# Patient Record
Sex: Male | Born: 1970 | Race: White | Hispanic: No | Marital: Married | Smoking: Never smoker
Health system: Southern US, Community
[De-identification: ages and names within clinical notes are randomized; demographics above are authoritative.]

## PROBLEM LIST (undated history)

## (undated) DIAGNOSIS — K589 Irritable bowel syndrome without diarrhea: Secondary | ICD-10-CM

## (undated) DIAGNOSIS — Z872 Personal history of diseases of the skin and subcutaneous tissue: Secondary | ICD-10-CM

## (undated) DIAGNOSIS — K635 Polyp of colon: Secondary | ICD-10-CM

## (undated) HISTORY — PX: MINOR HEMORRHOIDECTOMY: SHX6238

## (undated) HISTORY — DX: Personal history of diseases of the skin and subcutaneous tissue: Z87.2

## (undated) HISTORY — PX: COLONOSCOPY W/ BIOPSIES: SHX1374

## (undated) HISTORY — PX: COLONOSCOPY: SHX174

## (undated) HISTORY — DX: Irritable bowel syndrome, unspecified: K58.9

## (undated) HISTORY — PX: VASECTOMY: SHX75

## (undated) HISTORY — DX: Polyp of colon: K63.5

---

## 2005-10-09 HISTORY — PX: PILONIDAL CYST EXCISION: SHX744

## 2006-12-20 ENCOUNTER — Emergency Department (HOSPITAL_COMMUNITY): Admission: EM | Admit: 2006-12-20 | Discharge: 2006-12-20 | Payer: Self-pay | Admitting: Emergency Medicine

## 2007-11-26 ENCOUNTER — Ambulatory Visit: Payer: Self-pay | Admitting: Family Medicine

## 2007-11-26 DIAGNOSIS — R197 Diarrhea, unspecified: Secondary | ICD-10-CM | POA: Insufficient documentation

## 2007-11-28 LAB — CONVERTED CEMR LAB
ALT: 25 units/L (ref 0–35)
AST: 23 units/L (ref 0–37)
Albumin: 4.2 g/dL (ref 3.5–5.2)
Basophils Absolute: 0 10*3/uL (ref 0.0–0.1)
Basophils Relative: 0.3 % (ref 0.0–1.0)
CO2: 30 meq/L (ref 19–32)
Calcium: 9.3 mg/dL (ref 8.4–10.5)
Chloride: 104 meq/L (ref 96–112)
Cholesterol: 210 mg/dL (ref 0–200)
Eosinophils Absolute: 0.1 10*3/uL (ref 0.0–0.6)
Eosinophils Relative: 1.2 % (ref 0.0–5.0)
GFR calc Af Amer: 73 mL/min
Glucose, Bld: 92 mg/dL (ref 70–99)
HCT: 41.9 % (ref 36.0–46.0)
Lymphocytes Relative: 32.1 % (ref 12.0–46.0)
MCV: 89.1 fL (ref 78.0–100.0)
Monocytes Absolute: 0.4 10*3/uL (ref 0.2–0.7)
Monocytes Relative: 7.9 % (ref 3.0–11.0)
RDW: 11.8 % (ref 11.5–14.6)
Sed Rate: 15 mm/hr (ref 0–25)
TSH: 1.11 microintl units/mL (ref 0.35–5.50)
Total Bilirubin: 0.8 mg/dL (ref 0.3–1.2)
Triglycerides: 166 mg/dL — ABNORMAL HIGH (ref 0–149)
WBC: 5.3 10*3/uL (ref 4.5–10.5)

## 2010-02-01 ENCOUNTER — Ambulatory Visit: Payer: Self-pay | Admitting: Family Medicine

## 2010-02-01 DIAGNOSIS — S40269A Insect bite (nonvenomous) of unspecified shoulder, initial encounter: Secondary | ICD-10-CM | POA: Insufficient documentation

## 2010-02-01 DIAGNOSIS — W57XXXA Bitten or stung by nonvenomous insect and other nonvenomous arthropods, initial encounter: Secondary | ICD-10-CM

## 2010-05-06 ENCOUNTER — Telehealth (INDEPENDENT_AMBULATORY_CARE_PROVIDER_SITE_OTHER): Payer: Self-pay | Admitting: *Deleted

## 2010-05-09 ENCOUNTER — Ambulatory Visit: Payer: Self-pay | Admitting: Family Medicine

## 2010-05-09 LAB — CONVERTED CEMR LAB
Albumin: 3.9 g/dL (ref 3.5–5.2)
Alkaline Phosphatase: 87 units/L (ref 39–117)
Bilirubin, Direct: 0.1 mg/dL (ref 0.0–0.3)
CO2: 29 meq/L (ref 19–32)
Cholesterol: 205 mg/dL — ABNORMAL HIGH (ref 0–200)
Creatinine, Ser: 1 mg/dL (ref 0.4–1.2)
Direct LDL: 82 mg/dL
Glucose, Bld: 101 mg/dL — ABNORMAL HIGH (ref 70–99)
Total Bilirubin: 0.6 mg/dL (ref 0.3–1.2)
Total CHOL/HDL Ratio: 6
VLDL: 79.2 mg/dL — ABNORMAL HIGH (ref 0.0–40.0)

## 2010-05-20 ENCOUNTER — Ambulatory Visit: Payer: Self-pay | Admitting: Family Medicine

## 2010-08-02 ENCOUNTER — Ambulatory Visit: Payer: Self-pay | Admitting: Internal Medicine

## 2010-08-02 ENCOUNTER — Encounter: Payer: Self-pay | Admitting: Family Medicine

## 2010-08-02 DIAGNOSIS — L255 Unspecified contact dermatitis due to plants, except food: Secondary | ICD-10-CM

## 2010-11-08 NOTE — Letter (Signed)
Summary: Out of Work  [facility] at [facility]  [address] [place], [place] [address]   Phone: [phone]  Fax: [phone]    August 02, 2010   Employee:  Raymond Lloyd    To Whom It May Concern:   For Medical reasons, please excuse the above named employee from work for the following dates:  Start:  August 02, 2010   End:  August 02, 2010   Pt should be able to return to full duty, however if he feels he can't he may return to light duty.  If you need additional information, please feel free to contact our office.         Sincerely,    Eustaquio Boyden  MD

## 2010-11-08 NOTE — Assessment & Plan Note (Signed)
Summary: SPIDER BITE/DLO   Vital Signs:  Patient profile:   40 year old male Height:      71 inches Weight:      204.75 pounds BMI:     28.66 Temp:     98.2 degrees F oral Pulse rate:   72 / minute Pulse rhythm:   regular BP sitting:   112 / 72  (right arm) Cuff size:   large  Vitals Entered By: Lewanda Rife LPN (February 01, 2010 9:01 AM) CC: spider bite to left upper arm on 01/30/10   History of Present Illness: was out mowing sunday - felt a bite -- wiped off bug and did not see it  bite area is gradually growing  does not feel like a sting -- and no tic  no fever- feeling fine  area on R arm - is itching mostly and hurting just a bit   at night is taking some benadryl-- this helps  in daytme some benadryl cream   has never had a bit like this before   Allergies (verified): No Known Drug Allergies  Past History:  Past Surgical History: Last updated: 11/26/2007 2007 pilonidal cyst removal vasectomy  Family History: Last updated: 11/26/2007 father: HTN, high chol PGF: MI age 28 mother: breast cancer PGM: ? cancer ? 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 Eyes:  Denies blurring and eye pain. CV:  Denies chest pain or discomfort, palpitations, shortness of breath with exertion, and swelling of feet. Resp:  Denies cough and wheezing. GI:  Denies abdominal pain, change in bowel habits, nausea, and vomiting. GU:  Denies dysuria. MS:  Denies joint pain and muscle aches. Derm:  Complains of itching and  lesion(s); denies poor wound healing and rash. Neuro:  Denies numbness and tingling. Heme:  Denies abnormal bruising and enlarge lymph nodes.  Physical Exam  General:  Well-developed,well-nourished,in no acute distress; alert,appropriate and cooperative throughout examination Head:  normocephalic, atraumatic, and no abnormalities observed.   Eyes:  vision grossly intact, pupils equal, pupils round, and pupils reactive to light.  no injection of d/c  Neck:  No deformities, masses, or tenderness noted. Heart:  Normal rate and regular rhythm. S1 and S2 normal without gallop, murmur, click, rub or other extra sounds. Msk:  no acute joint changes  Extremities:  No clubbing, cyanosis, edema, or deformity noted with normal full range of motion of all joints.   Skin:  2-3 cm area of induration and redness upper L arm (bicep area) papule in center no stinger or insect parts seen no bruising or necrosis noted no excoriations Cervical Nodes:  No lymphadenopathy noted Psych:  normal affect, talkative and pleasant    Impression & Recommendations:  Problem # 1:  INSECT BITE, UPPER ARM (ICD-912.4) Assessment New  with redness / induration that is worsening / but no constitutional symptoms  will tx for both all rxn and infection -- since unable to tell which  px keflex two times a day/ elicon cream and will try zyrtec  lines drawn around area of redness- if this increases or any worsening - adv to update  asap  also update if fever or not imp in several days   Orders: Prescription Created Electronically [phone])  Complete Medication List: 1)  Multivitamins Tabs (Multiple vitamin) .... Take 1 tablet by mouth once a day 2)  Fish Oil Oil (Fish oil) .... Take one daily by mouth 3)  Glucosamine-chondroitin Caps (Glucosamine-chondroit-vit c-mn) .... Otc as directed. 4)  Benadryl 25 Mg Caps (Diphenhydramine hcl) .... Otc as directed. 5)  Keflex 500 Mg Caps (Cephalexin) .Marland Kitchen.. 1 by mouth two times a day  for 7 days with food 6)  Elocon 0.1 % Crea (Mometasone furoate) .... Apply to affected area once daily  Patient Instructions: 1)  keep area clean and dry 2)  update me if redness goes outside the line or if fever or other symptoms  3)  try zyrtec 10 mg once daily for itch  4)  use elicon cream daily  5)  take keflex as directed  Prescriptions: ELOCON 0.1 % CREA (MOMETASONE FUROATE) apply to affected area once daily  #1 small x 0   Entered and Authorized by:   Judith Part MD   Signed by:   Judith Part MD on 02/01/2010   Method used:   [facility] ([facility]ail[facility]       [address]       [place], [place]  [address]       Ph: [phone] or [phone]       Fax: [phone]   RxID:   [id number] KEFLEX 500 MG CAPS (CEPHALEXIN) 1 by mouth two times a day for 7 days with food  #14 x 0   Entered and Authorized by:   Judith Part MD   Signed by:   Judith Part MD on 02/01/2010   Method used:   [facility]        [facility] (retail)       [address]       [place], [place]  [address]       Ph: [phone] or [phone]       Fax: [phone]   RxID:   [id number]   Current Allergies (reviewed today): No known allergies

## 2010-11-08 NOTE — Assessment & Plan Note (Signed)
Summary: CPX  CYD SP W/ PT RESC   Vital Signs:  Patient profile:   40 year old male Height:      71 inches Weight:      197.4 pounds BMI:     27.63 Temp:     98.2 degrees F oral Pulse rate:   72 / minute Pulse rhythm:   regular BP sitting:   110 / 82  (left arm) Cuff size:   large  Vitals Entered By: Benny Lennert CMA Duncan Dull) (May 20, 2010 8:31 AM)  History of Present Illness: Chief complaint cpx   The patient is here for annual wellness exam and preventative care.    Poor diet..has started back exercising.   Problems Prior to Update: 1)  Preventive Health Care  (ICD-V70.0) 2)  Insect Bite, Upper Arm  (ICD-912.4) 3)  Screening For Lipoid Disorders  (ICD-V77.91) 4)  Diarrhea  (ICD-787.91)  Current Medications (verified): 1)  Multivitamins   Tabs (Multiple Vitamin) .... Take 1 Tablet By Mouth Once A Day 2)  Fish Oil   Oil (Fish Oil) .... Take One Daily By Mouth 3)  Glucosamine-Chondroitin  Caps (Glucosamine-Chondroit-Vit C-Mn) .... Otc As Directed.  Allergies (verified): No Known Drug Allergies  Past History:  Past medical, surgical, family and social histories (including risk factors) reviewed, and no changes noted (except as noted below).  Past Surgical History: Reviewed history from 11/26/2007 and no changes required. 2007 pilonidal cyst removal vasectomy  Family History: Reviewed history from 11/26/2007 and no changes required. father: HTN, high chol PGF: MI age 25 mother: breast cancer PGM: ? cancer ? maternal side history  Social History: Reviewed history from 11/26/2007 and no changes required. Occupation: Works for [employer], 12 hour shifts Previously in [employer], [employer] Married 2 children, healthy Never Smoked Alcohol use-yes, 0-6 per week Drug use-no Regular exercise-yes, 3-5 days per week Diet: fast food, water  Review of Systems       Eats a lot of fast food.  General:  Complains of fatigue. CV:  Denies chest pain or  discomfort. Resp:  Denies shortness of breath. GI:  Complains of bloody stools and diarrhea; denies abdominal pain and constipation; HAs always had diarrhea, stable...occ blood in stool over years after he has eaten something that triggers diarrhea...he has these bowel changes with soda.. Derm:  Denies rash. Psych:  Denies anxiety, depression, suicidal thoughts/plans, and thoughts /plans of harming others.  Physical Exam  General:  Well-developed,well-nourished,in no acute distress; alert,appropriate and cooperative throughout examination Eyes:  No corneal or conjunctival inflammation noted. EOMI. Perrla. Funduscopic exam benign, without hemorrhages, exudates or papilledema. Vision grossly normal. Ears:  External ear exam shows no significant lesions or deformities.  Otoscopic examination reveals clear canals, tympanic membranes are intact bilaterally without bulging, retraction, inflammation or discharge. Hearing is grossly normal bilaterally. Nose:  External nasal examination shows no deformity or inflammation. Nasal mucosa are pink and moist without lesions or exudates. Mouth:  Oral mucosa and oropharynx without lesions or exudates.  Teeth in good repair. Neck:  no carotid bruit or thyromegaly no cervical or supraclavicular lymphadenopathy  Lungs:  Normal respiratory effort, chest expands symmetrically. Lungs are clear to auscultation, no crackles or wheezes. Heart:  Normal rate and regular rhythm. S1 and S2 normal without gallop, murmur, click, rub or other extra sounds. Abdomen:  Bowel sounds positive,abdomen soft and non-tender without masses, organomegaly or hernias noted. Rectal:  refused Genitalia:  Normal introitus for age, no external lesions, no vaginal discharge, mucosa pink and  moist, no vaginal or cervical lesions, no vaginal atrophy, no friaility or hemorrhage, normal uterus size and position, no adnexal masses or tenderness Msk:  No deformity or scoliosis noted of thoracic or  lumbar spine.   Pulses:  R and L posterior tibial pulses are full and equal bilaterally  Extremities:  no edema Neurologic:  No cranial nerve deficits noted. Station and gait are normal. Plantar reflexes are down-going bilaterally. DTRs are symmetrical throughout. Sensory, motor and coordinative functions appear intact. Skin:  Intact without suspicious lesions or rashes Psych:  Cognition and judgment appear intact. Alert and cooperative with normal attention span and concentration. No apparent delusions, illusions, hallucinations   Impression & Recommendations:  Problem # 1:  Preventive Health Care (ICD-V70.0) The patient's preventative maintenance and recommended screening tests for an annual wellness exam were reviewed in full today. Brought up to date unless services declined.  Counselled on the importance of diet, exercise, and its role in overall health and mortality. The patient's FH and SH was reviewed, including their home life, tobacco status, and drug and alcohol status.     Problem # 2:  HYPERTRIGLYCERIDEMIA (ICD-272.1)  Encouraged exercise, weight loss, healthy eating habits.  Increase fish oil.  Recheck fasting LIPIDS, AST, ALT  in 6 months Dx 272.0     Labs Reviewed: SGOT: 21 (05/09/2010)   SGPT: 21 (05/09/2010)   HDL:34.60 (05/09/2010), 41.4 (11/26/2007)  LDL:DEL (11/26/2007)  Chol:205 (05/09/2010), 210 (11/26/2007)  Trig:396.0 (05/09/2010), 166 (11/26/2007)  Problem # 3:  DIARRHEA (ICD-787.91) Chronic...consider further eval...cbc, TSH, Gi referral. 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   Complete Medication List: 1)  Multivitamins Tabs (Multiple vitamin) .... Take 1 tablet by mouth once a day 2)  Fish Oil Oil (Fish oil) .... Take one daily by mouth 3)  Glucosamine-chondroitin Caps (Glucosamine-chondroit-vit c-mn) .... Otc as directed.  Other Orders: Tdap => 3yrs IM ([address]) Admin 1st Vaccine  ([address]) Admin 1st Vaccine [facility]) [phone])  Patient Instructions: 1)  Avoid fast food, fried foods, and carbohydrates. 2)  Increase exercise. 3)  Increase  fish oil 2000 mg divided daily.Marland Kitchenlook for active ingredients..DHA and EPA. 4)  Recheck fasting LIPIDS, AST, ALT  in 6 months Dx 272.0    5)  MAke follow up appt for further eval if blood in stool continuing or diarrhea increasing.  6)  Please schedule a follow-up appointment in 1 year.   Current Allergies (reviewed today): No known allergies      Tetanus/Td Vaccine    Vaccine Type: Tdap    Site: left deltoid    Mfr: GlaxoSmithKline    Dose: 0.5 ml    Route: IM    Given by: Benny Lennert CMA (AAMA)    Exp. Date: 04/07/2012    Lot #: [id number]    VIS given: 08/27/07 version given May 20, 2010.

## 2010-11-08 NOTE — Progress Notes (Signed)
----   Converted from flag ---- ---- 05/06/2010 12:36 AM, Kerby Nora MD wrote: CMEt, lipids Dx v77.91  ---- 05/05/2010 11:12 AM, Liane Comber CMA (AAMA) wrote: Pt is scheduled for cpx labs Monday, what labs to draw and dx codes? Thanks Tasha ------------------------------

## 2010-11-08 NOTE — Assessment & Plan Note (Signed)
Summary: POISON IVY/CLE   Vital Signs:  Patient profile:   40 year old male Weight:      198.25 pounds Temp:     98.5 degrees F oral Pulse rate:   74 / minute Pulse rhythm:   regular BP sitting:   110 / 80  (right arm) Cuff size:   large  Vitals Entered By: Selena Batten Dance CMA Duncan Dull) (August 02, 2010 9:20 AM) CC: Poison Ivy Comments Seen at urgent care on saturday. Given Predisone dose pack and steroid injection. Not getting any better.    History of Present Illness: CC: skin rash  broke out in rash saturday morning, went to [facility] sunday, dx poison ivy, prescribed 20mg  prednisone and shot of steroids at [facility].  Not helping.  Was working outside in [place] prior to break out.  Has also been using benadryl and calagel (anti itch).  h/o eczema in past.  + diarrhea since steroid shot  Also noted spiders but doesn't think bitten.  no fevers/chills, n/v, abd pain.  Current Medications (verified): 1)  Multivitamins   Tabs (Multiple Vitamin) .... Take 1 Tablet By Mouth Once A Day 2)  Fish Oil   Oil (Fish Oil) .... Take One Daily By Mouth 3)  Glucosamine-Chondroitin  Caps (Glucosamine-Chondroit-Vit C-Mn) .... Otc As Directed. 4)  Prednisone (Pak) 10 Mg Tabs (Prednisone) .... By Mouth As Directed 5)  [place]-N-Ivy Skin Cleanser  Liqd (Poison Ivy Treatments) .... As Directed 6)  Calagel Maximum Strength  Gel (Diphenhyd-Benzeth-Menth-Zn Ace) .... As Directed  Allergies (verified): No Known Drug Allergies  Past History:  Past Medical History: h/o eczema  Social History: Reviewed history from 11/26/2007 and no changes required. Occupation: Works for [employer], 12 hour shifts Previously in [employer], [employer] Married 2 children, healthy Never Smoked Alcohol use-yes, 0-6 per week Drug use-no Regular exercise-yes, 3-5 days per week Diet: fast food, water  Review of Systems       per HPI  Physical Exam  General:  Well-developed,well-nourished,in no acute distress;  alert,appropriate and cooperative throughout examination Skin:  papular some linear erythematous rash bilateral anterior forearms starting from wrist to elbow.  +++ pruritic.   Impression & Recommendations:  Problem # 1:  POISON IVY DERMATITIS (ICD-692.6)  in patient with h/o strong skin reactions to allergens/contact, h/o eczema.  increase dose of steroids and use triamcinolone ointment to help itch.  continue benadryl.  to call and let us know how he is doing.  could consider adding ranitidine.  His updated medication list for this problem includes:    Prednisone 20 Mg Tabs (Prednisone) .Marland Kitchen... Take 3 a day for 3 days then 2 a day for 3 days then 1 a day for 3 days then 1/2 a day for 4 days    Triamcinolone Acetonide 0.5 % Oint (Triamcinolone acetonide) .Marland Kitchen... Apply to aa two times a day  Discussed avoidance of triggers and symptomatic treatment.   Complete Medication List: 1)  Multivitamins Tabs (Multiple vitamin) .... Take 1 tablet by mouth once a day 2)  Fish Oil Oil (Fish oil) .... Take one daily by mouth 3)  Glucosamine-chondroitin Caps (Glucosamine-chondroit-vit c-mn) .... Otc as directed. 4)  Prednisone 20 Mg Tabs (Prednisone) .... Take 3 a day for 3 days then 2 a day for 3 days then 1 a day for 3 days then 1/2 a day for 4 days 5)  [place]-n-ivy Skin Cleanser Liqd (Poison ivy treatments) .... As directed 6)  Calagel Maximum Strength Gel (Diphenhyd-benzeth-menth-zn ace) .... As  directed 7)  Triamcinolone Acetonide 0.5 % Oint (Triamcinolone acetonide) .... Apply to aa two times a day  Patient Instructions: 1)  increase prednisone (new prescription sent). 2)  continue benadryl. 3)  use steroid ointment as prescribed (twice daily) 4)  Let us know if not helping. Prescriptions: TRIAMCINOLONE ACETONIDE 0.5 % OINT (TRIAMCINOLONE ACETONIDE) apply to AA two times a day  #1 x 1   Entered and Authorized by:   Eustaquio Boyden  MD   Signed by:   Eustaquio Boyden  MD on 08/02/2010    Method used:   [phone]       [address]       [place], [place]  [address]       Ph: [phone] or [phone]       Fax: [phone]   RxID:   [id number] PREDNISONE 20 MG TABS (PREDNISONE) take 3 a day for 3 days then 2 a day for 3 days then 1 a day for 3 days then 1/2 a day for 4 days  #20 x 0   Entered and Authorized by:   Eustaquio Boyden  MD   Signed by:   Eustaquio Boyden  MD on 08/02/2010   Method used:   [facility]        [facility] [phone][facility] (retail)       [address]       [place], [place]  [address]       Ph: [phone] or [phone]       Fax: [phone]   RxID:   [id number]    Orders Added: 1)  Est. Patient Level III [99[address]]    Current Allergies (reviewed today): No known allergies

## 2010-11-21 ENCOUNTER — Other Ambulatory Visit: Payer: Self-pay | Admitting: Family Medicine

## 2010-11-21 ENCOUNTER — Other Ambulatory Visit (INDEPENDENT_AMBULATORY_CARE_PROVIDER_SITE_OTHER): Payer: 59

## 2010-11-21 ENCOUNTER — Encounter (INDEPENDENT_AMBULATORY_CARE_PROVIDER_SITE_OTHER): Payer: Self-pay | Admitting: *Deleted

## 2010-11-21 DIAGNOSIS — E785 Hyperlipidemia, unspecified: Secondary | ICD-10-CM

## 2010-11-21 DIAGNOSIS — Z13220 Encounter for screening for lipoid disorders: Secondary | ICD-10-CM

## 2010-11-21 LAB — LDL CHOLESTEROL, DIRECT: Direct LDL: 122 mg/dL

## 2010-11-21 LAB — LIPID PANEL
Cholesterol: 224 mg/dL — ABNORMAL HIGH (ref 0–200)
HDL: 46.3 mg/dL (ref >39.00)
Triglycerides: 207 mg/dL — ABNORMAL HIGH (ref 0.0–149.0)
VLDL: 41.4 mg/dL — ABNORMAL HIGH (ref 0.0–40.0)

## 2010-11-21 LAB — AST: AST: 22 U/L (ref 0–37)

## 2010-11-21 LAB — ALT: ALT: 22 U/L (ref 0–35)

## 2011-03-08 ENCOUNTER — Encounter: Payer: Self-pay | Admitting: Family Medicine

## 2011-03-09 ENCOUNTER — Ambulatory Visit (INDEPENDENT_AMBULATORY_CARE_PROVIDER_SITE_OTHER): Payer: 59 | Admitting: Family Medicine

## 2011-03-09 ENCOUNTER — Encounter: Payer: Self-pay | Admitting: Family Medicine

## 2011-03-09 DIAGNOSIS — R1032 Left lower quadrant pain: Secondary | ICD-10-CM | POA: Insufficient documentation

## 2011-03-09 NOTE — Assessment & Plan Note (Signed)
No hernia felt.  Anatomy d/w pt.  Likely resolving mild groin strain and okay for outpatient fu.  If he notices a mass, he'll notify us.  He agrees with plan.

## 2011-03-09 NOTE — Patient Instructions (Signed)
I think this is likely a strain.  I would gently stretch.  If you have more pain or notice a bulge, then notify the clinic.  Take care.

## 2011-03-09 NOTE — Progress Notes (Signed)
Groin pain.  Pain in L groin/L lower abd started Sunday.  Intermittent.  Better supine, worse when up and moving. Worse Monday and Tuesday, but still intermittent.  Yesterday was better, today is better still. No mass felt by patient.  No FCNAVD. No change in BM/urination.  Only trigger for the pain known- he had to strain for a BM before the pain started.   Meds, vitals, and allergies reviewed.   ROS: See HPI.  Otherwise, noncontributory.  nad ncat rrr ctab Abdomen soft, not ttp, normal BS Testes bilaterally descended without abnormality noted. No scrotal masses or lesions. No penis lesions or urethral discharge.  No hernia felt.

## 2011-10-27 ENCOUNTER — Ambulatory Visit (INDEPENDENT_AMBULATORY_CARE_PROVIDER_SITE_OTHER): Payer: 59 | Admitting: Family Medicine

## 2011-10-27 ENCOUNTER — Encounter: Payer: Self-pay | Admitting: Family Medicine

## 2011-10-27 VITALS — BP 120/84 | HR 82 | Temp 98.6°F | Ht 72.0 in | Wt 203.4 lb

## 2011-10-27 DIAGNOSIS — R0789 Other chest pain: Secondary | ICD-10-CM | POA: Insufficient documentation

## 2011-10-27 NOTE — Patient Instructions (Signed)
Ibuprofen 800 mg three times a day for 3-4 days. Gentle chest wall stretches. Limit heavy lifting. Call if not improving as expected.

## 2011-10-27 NOTE — Progress Notes (Signed)
  Subjective:    Patient ID: Raymond Lloyd, male    DOB: 05-25-1971, 41 y.o.   MRN: [id number]  HPI   41 year old male presents with 4-5 days of tightness in central upper chest. Over upper strnum and right clavicle. Pain had been intermittent, more constant today. 2-3 to 5/10. No change with moving arms. Pain occurs with each regular breaths in. No change with eating.  Does note issue more when he is walking. Pain increases also with head upright, improves some when leaning head forward.  No fall, no injuries, no lifting.  Also has mild headache. No SOB. No nausea.  Not taking any differnet med.Marland Kitchen Has been taking NSAIDs for stress fracture since 13. Was not using daily. No heartburn.   Dad (HTN age 79, no MI), brother (HTN since 27, no MI ), uncles heart attack at young age.   Review of Systems  Constitutional: Negative for fever and fatigue.  HENT: Negative for ear pain, congestion and rhinorrhea.   Eyes: Negative for pain.  Respiratory: Positive for chest tightness. Negative for shortness of breath and wheezing.   Cardiovascular: Positive for chest pain. Negative for palpitations and leg swelling.  Gastrointestinal: Negative for diarrhea, constipation and abdominal distention.  Musculoskeletal: Negative for myalgias.  Skin: Negative for rash.       Objective:   Physical Exam  Constitutional: He appears well-developed and well-nourished. No distress.  HENT:  Head: Normocephalic.  Right Ear: External ear normal.  Left Ear: External ear normal.  Nose: Nose normal.  Mouth/Throat: Oropharynx is clear and moist.  Eyes: Conjunctivae are normal. Pupils are equal, round, and reactive to light.  Neck: Normal range of motion. Neck supple. No thyromegaly present.  Cardiovascular: Normal rate, regular rhythm, normal heart sounds and intact distal pulses.  Exam reveals no gallop and no friction rub.   No murmur heard. Pulmonary/Chest: Effort normal and breath sounds normal. No  respiratory distress. He has no wheezes. He has no rales. He exhibits tenderness.       ttp right upper chest wall where sternoclavicular joint and somewhat below  Musculoskeletal:       Cervical back: Normal. He exhibits normal range of motion and no tenderness.       Thoracic back: Normal.  Skin: No rash noted. He is not diaphoretic.  Psychiatric: He has a normal mood and affect. His behavior is normal. Judgment and thought content normal.          Assessment & Plan:

## 2011-10-27 NOTE — Assessment & Plan Note (Addendum)
EKG showed: NSR, no ST changes, no LVH, no Q. Most likely due to MSK/costochondritis, vs. Less likely pleurisy, viral pericarditis. N symptoms of infection, no GERD. Will treat with chest wall stretches, NSAIDs. Call if not improving as expected.

## 2012-03-26 ENCOUNTER — Telehealth: Payer: Self-pay | Admitting: Family Medicine

## 2012-03-26 DIAGNOSIS — Z13220 Encounter for screening for lipoid disorders: Secondary | ICD-10-CM

## 2012-03-26 NOTE — Telephone Encounter (Signed)
Message copied by Excell Seltzer on Tue Mar 26, 2012  2:42 PM ------      Message from: Baldomero Lamy      Created: Tue Mar 26, 2012  7:59 AM      Regarding: Cpx labs Fri 6/21       Please order  future cpx labs for pt's upcomming lab appt.      Thanks      Rodney Booze

## 2012-03-29 ENCOUNTER — Other Ambulatory Visit (INDEPENDENT_AMBULATORY_CARE_PROVIDER_SITE_OTHER): Payer: 59

## 2012-03-29 ENCOUNTER — Other Ambulatory Visit: Payer: 59

## 2012-03-29 DIAGNOSIS — Z13220 Encounter for screening for lipoid disorders: Secondary | ICD-10-CM

## 2012-03-29 LAB — LIPID PANEL
HDL: 43.7 mg/dL (ref >39.00)
Total CHOL/HDL Ratio: 5
Triglycerides: 183 mg/dL — ABNORMAL HIGH (ref 0.0–149.0)
VLDL: 36.6 mg/dL (ref 0.0–40.0)

## 2012-03-29 LAB — COMPREHENSIVE METABOLIC PANEL
ALT: 20 U/L (ref 0–53)
AST: 18 U/L (ref 0–37)
Alkaline Phosphatase: 83 U/L (ref 39–117)
BUN: 13 mg/dL (ref 6–23)
Chloride: 102 mEq/L (ref 96–112)
Creatinine, Ser: 1 mg/dL (ref 0.4–1.5)
Potassium: 4 mEq/L (ref 3.5–5.1)
Sodium: 139 mEq/L (ref 135–145)

## 2012-04-05 ENCOUNTER — Encounter: Payer: Self-pay | Admitting: Family Medicine

## 2012-04-05 ENCOUNTER — Ambulatory Visit (INDEPENDENT_AMBULATORY_CARE_PROVIDER_SITE_OTHER): Payer: 59 | Admitting: Family Medicine

## 2012-04-05 VITALS — BP 104/70 | HR 68 | Temp 98.1°F | Ht 71.0 in | Wt 193.8 lb

## 2012-04-05 DIAGNOSIS — Z0000 Encounter for general adult medical examination without abnormal findings: Secondary | ICD-10-CM

## 2012-04-05 DIAGNOSIS — E781 Pure hyperglyceridemia: Secondary | ICD-10-CM | POA: Insufficient documentation

## 2012-04-05 NOTE — Progress Notes (Signed)
Subjective:    Patient ID: Raymond Lloyd, male    DOB: 02-14-1971, 41 y.o.   MRN: [id number]  HPI  The patient is here for annual wellness exam and preventative care.    Last year had stress fractures in B feet. Took time off and symptoms resolved.  Exercise 2-3 days a week. Runs at park 2 -3 miles.  Diet: moderately 10 lb weight loss in last few months.. Eating more salads.  Reviewed labs in detail with pt.  Review of Systems  Constitutional: Negative for fever, fatigue and unexpected weight change.  HENT: Negative for ear pain, congestion, sore throat, rhinorrhea, trouble swallowing and postnasal drip.   Eyes: Negative for pain.  Respiratory: Negative for cough, shortness of breath and wheezing.   Cardiovascular: Negative for chest pain, palpitations and leg swelling.  Gastrointestinal: Negative for nausea, abdominal pain, diarrhea, constipation and blood in stool.  Genitourinary: Negative for dysuria, urgency, hematuria, discharge, penile swelling, scrotal swelling, difficulty urinating, penile pain and testicular pain.  Skin: Negative for rash.  Neurological: Negative for syncope, weakness, light-headedness, numbness and headaches.  Psychiatric/Behavioral: Negative for behavioral problems and dysphoric mood. The patient is not nervous/anxious.        Objective:   Physical Exam  Constitutional: He appears well-developed and well-nourished.  Non-toxic appearance. He does not appear ill. No distress.  HENT:  Head: Normocephalic and atraumatic.  Right Ear: Hearing, tympanic membrane, external ear and ear canal normal.  Left Ear: Hearing, tympanic membrane, external ear and ear canal normal.  Nose: Nose normal.  Mouth/Throat: Uvula is midline, oropharynx is clear and moist and mucous membranes are normal.  Eyes: Conjunctivae, EOM and lids are normal. Pupils are equal, round, and reactive to light. No foreign bodies found.  Neck: Trachea normal, normal range of motion and  phonation normal. Neck supple. Carotid bruit is not present. No mass and no thyromegaly present.  Cardiovascular: Normal rate, regular rhythm, S1 normal, S2 normal, intact distal pulses and normal pulses.  Exam reveals no gallop.   No murmur heard. Pulmonary/Chest: Breath sounds normal. He has no wheezes. He has no rhonchi. He has no rales.  Abdominal: Soft. Normal appearance and bowel sounds are normal. There is no hepatosplenomegaly. There is no tenderness. There is no rebound, no guarding and no CVA tenderness. No hernia. Hernia confirmed negative in the right inguinal area and confirmed negative in the left inguinal area.  Genitourinary: Testes normal and penis normal. Right testis shows no mass and no tenderness. Left testis shows no mass and no tenderness. No paraphimosis or penile tenderness.  Lymphadenopathy:    He has no cervical adenopathy.       Right: No inguinal adenopathy present.       Left: No inguinal adenopathy present.  Neurological: He is alert. He has normal strength and normal reflexes. No cranial nerve deficit or sensory deficit. Gait normal.  Skin: Skin is warm, dry and intact. No rash noted.  Psychiatric: He has a normal mood and affect. His speech is normal and behavior is normal. Judgment normal.          Assessment & Plan:  The patient's preventative maintenance and recommended screening tests for an annual wellness exam were reviewed in full today. Brought up to date unless services declined.  Counselled on the importance of diet, exercise, and its role in overall health and mortality. The patient's FH and SH was reviewed, including their home life, tobacco status, and drug and alcohol status.   Nonsmoker.  Tetanus uptodate.

## 2012-04-05 NOTE — Patient Instructions (Addendum)
Work on exercise, healthy eating and weight loss.

## 2012-04-05 NOTE — Assessment & Plan Note (Signed)
Improving. Encouraged exercise, weight loss, healthy eating habits.  

## 2012-10-09 HISTORY — PX: HEMORRHOID BANDING: SHX5850

## 2013-04-04 ENCOUNTER — Other Ambulatory Visit (INDEPENDENT_AMBULATORY_CARE_PROVIDER_SITE_OTHER): Payer: 59

## 2013-04-04 ENCOUNTER — Telehealth: Payer: Self-pay | Admitting: Family Medicine

## 2013-04-04 DIAGNOSIS — E781 Pure hyperglyceridemia: Secondary | ICD-10-CM

## 2013-04-04 LAB — COMPREHENSIVE METABOLIC PANEL
ALT: 33 U/L (ref 0–53)
AST: 23 U/L (ref 0–37)
Calcium: 9.3 mg/dL (ref 8.4–10.5)
Creatinine, Ser: 1.1 mg/dL (ref 0.4–1.5)
Sodium: 139 mEq/L (ref 135–145)
Total Protein: 7.5 g/dL (ref 6.0–8.3)

## 2013-04-04 LAB — LIPID PANEL
Cholesterol: 227 mg/dL — ABNORMAL HIGH (ref 0–200)
Triglycerides: 252 mg/dL — ABNORMAL HIGH (ref 0.0–149.0)

## 2013-04-04 LAB — LDL CHOLESTEROL, DIRECT: Direct LDL: 116.6 mg/dL

## 2013-04-04 NOTE — Telephone Encounter (Signed)
Message copied by Excell Seltzer on Fri Apr 04, 2013  8:19 AM ------      Message from: Baldomero Lamy      Created: Fri Mar 21, 2013 11:38 AM      Regarding: Cpx labs Fri 6/27       Please order  future cpx labs for pt's upcoming lab appt.      Thanks      Tasha       ------

## 2013-04-08 ENCOUNTER — Encounter: Payer: Self-pay | Admitting: Family Medicine

## 2013-04-08 ENCOUNTER — Ambulatory Visit (INDEPENDENT_AMBULATORY_CARE_PROVIDER_SITE_OTHER): Payer: 59 | Admitting: Family Medicine

## 2013-04-08 VITALS — BP 120/60 | HR 69 | Temp 98.8°F | Ht 71.0 in | Wt 209.5 lb

## 2013-04-08 DIAGNOSIS — Z0000 Encounter for general adult medical examination without abnormal findings: Secondary | ICD-10-CM

## 2013-04-08 DIAGNOSIS — R21 Rash and other nonspecific skin eruption: Secondary | ICD-10-CM | POA: Insufficient documentation

## 2013-04-08 MED ORDER — TRIAMCINOLONE ACETONIDE 0.1 % EX CREA: TOPICAL_CREAM | Freq: Two times a day (BID) | CUTANEOUS | Status: DC

## 2013-04-08 NOTE — Progress Notes (Signed)
HPI  The patient is here for annual wellness exam and preventative care.   In last month he has had rash underarms... felt started after new [place].  Itchy, burning at time. Was improved but then flared up after sweating in yard more in last few days. Did not treat with anything.   Exercise has not been exercising at all anymore in last few weeks..  Diet: moderately  Some weihgt gain back in last year. Wt Readings from Last 3 Encounters:  04/08/13 209 lb 8 oz (95.029 kg)  04/05/12 193 lb 12 oz (87.884 kg)  10/27/11 203 lb 6.4 oz (92.262 kg)    Reviewed labs in detail with pt.   Hypertriglycerides... Remains elevated despite fish oil 2000 mg daily... Turns out he is not taking regularly. Lab Results  Component Value Date   CHOL 227* 04/04/2013   HDL 45.60 04/04/2013   LDLCALC 117* 03/29/2012   LDLDIRECT 116.6 04/04/2013   TRIG 252.0* 04/04/2013   CHOLHDL 5 04/04/2013     Review of Systems  Constitutional: Negative for fever, fatigue and unexpected weight change.  HENT: Negative for ear pain, congestion, sore throat, rhinorrhea, trouble swallowing and postnasal drip.  Eyes: Negative for pain.  Respiratory: Negative for cough, shortness of breath and wheezing.  Cardiovascular: Negative for chest pain, palpitations and leg swelling.  Gastrointestinal: Negative for nausea, abdominal pain, diarrhea, constipation and blood in stool.  Genitourinary: Negative for dysuria, urgency, hematuria, discharge, penile swelling, scrotal swelling, difficulty urinating, penile pain and testicular pain.  Skin: Positivefor rash underarms..  Neurological: Negative for syncope, weakness, light-headedness, numbness and headaches.  Psychiatric/Behavioral: Negative for behavioral problems and dysphoric mood. The patient is not nervous/anxious.  Objective:   Physical Exam  Constitutional: He appears well-developed and well-nourished. Non-toxic appearance. He does not appear ill. No distress.  HENT:   Head: Normocephalic and atraumatic.  Right Ear: Hearing, tympanic membrane, external ear and ear canal normal.  Left Ear: Hearing, tympanic membrane, external ear and ear canal normal.  Nose: Nose normal.  Mouth/Throat: Uvula is midline, oropharynx is clear and moist and mucous membranes are normal.  Eyes: Conjunctivae, EOM and lids are normal. Pupils are equal, round, and reactive to light. No foreign bodies found.  Neck: Trachea normal, normal range of motion and phonation normal. Neck supple. Carotid bruit is not present. No mass and no thyromegaly present.  Cardiovascular: Normal rate, regular rhythm, S1 normal, S2 normal, intact distal pulses and normal pulses. Exam reveals no gallop.  No murmur heard.  Pulmonary/Chest: Breath sounds normal. He has no wheezes. He has no rhonchi. He has no rales.  Abdominal: Soft. Normal appearance and bowel sounds are normal. There is no hepatosplenomegaly. There is no tenderness. There is no rebound, no guarding and no CVA tenderness. No hernia. Hernia confirmed negative in the right inguinal area and confirmed negative in the left inguinal area.  Genitourinary: Testes normal and penis normal. Right testis shows no mass and no tenderness. Left testis shows no mass and no tenderness. No paraphimosis or penile tenderness.  Lymphadenopathy:  He has no cervical adenopathy.  Right: No inguinal adenopathy present.  Left: No inguinal adenopathy present.  Neurological: He is alert. He has normal strength and normal reflexes. No cranial nerve deficit or sensory deficit. Gait normal.  Skin: Skin is warm, dry and intact. No rash noted.  Psychiatric: He has a normal mood and affect. His speech is normal and behavior is normal. Judgment normal.  Assessment & Plan:   The patient's  preventative maintenance and recommended screening tests for an annual wellness exam were reviewed in full today.  Brought up to date unless services declined.  Counselled on the importance  of diet, exercise, and its role in overall health and mortality.  The patient's FH and SH was reviewed, including their home life, tobacco status, and drug and alcohol status.   Nonsmoker.  Tetanus uptodate.

## 2013-04-08 NOTE — Assessment & Plan Note (Signed)
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 Treat with topical steroid cream x 2 weeks.

## 2013-04-08 NOTE — Patient Instructions (Signed)
Get back on track with exercise and weight loss, get back on track with healthy eating. Take  at least 3000 mg daily of fish oil.  Apply steroid cream to area of rash.

## 2013-04-09 ENCOUNTER — Telehealth: Payer: Self-pay | Admitting: Family Medicine

## 2013-04-09 NOTE — Telephone Encounter (Signed)
Message copied by Excell Seltzer on Wed Apr 09, 2013  1:07 PM ------      Message from: Josph Macho A      Created: Fri Apr 04, 2013  8:20 AM       Lab orders please!! Thanks! ------

## 2013-06-13 ENCOUNTER — Ambulatory Visit (INDEPENDENT_AMBULATORY_CARE_PROVIDER_SITE_OTHER): Payer: 59 | Admitting: Family Medicine

## 2013-06-13 ENCOUNTER — Encounter: Payer: Self-pay | Admitting: Family Medicine

## 2013-06-13 VITALS — BP 120/80 | HR 77 | Temp 98.0°F | Ht 71.0 in | Wt 205.2 lb

## 2013-06-13 DIAGNOSIS — K644 Residual hemorrhoidal skin tags: Secondary | ICD-10-CM

## 2013-06-13 MED ORDER — HYDROCORTISONE ACETATE 25 MG RE SUPP: 25.0000 mg | Freq: Every day | RECTAL | Status: DC

## 2013-06-13 NOTE — Progress Notes (Signed)
  Subjective:    Patient ID: Raymond Lloyd, male    DOB: Apr 02, 1971, 42 y.o.   MRN: [id number]  HPI  42 year old male with remote history of hemmorhoids (had hemmorhoidectomy 10 years ago) presents with pain with BM ongoing 5 days, pain with wiping, no blood in stool. No pain with sitting but feel mild discomfort.  Has applied prep H. No abdominal pain, no fever.  He has history of IBs... Usual diarrhea predminent.. Probiotic helps but in las month has had to strain some.   Review of Systems  Constitutional: Negative for fever and fatigue.  HENT: Negative for ear pain.   Eyes: Negative for pain.  Respiratory: Negative for shortness of breath.   Cardiovascular: Negative for chest pain, palpitations and leg swelling.  Gastrointestinal: Negative for abdominal pain.       Objective:   Physical Exam  Constitutional: He appears well-developed and well-nourished.  Cardiovascular: Normal rate and regular rhythm.   Pulmonary/Chest: Effort normal and breath sounds normal.  Abdominal: Soft. Bowel sounds are normal. There is no tenderness.  Genitourinary: Rectal exam shows external hemorrhoid and tenderness. Rectal exam shows no internal hemorrhoid, no fissure, no mass and anal tone normal. Guaiac negative stool.  Anoscopy performed showing nonthrombosed painful grade 2 ext hemorrhoids.          Assessment & Plan:

## 2013-06-13 NOTE — Patient Instructions (Addendum)
Start rectal suppositories.   Push fluids and increase fiber in diet.  Call if not resolved in 2 weeks.

## 2013-06-13 NOTE — Assessment & Plan Note (Signed)
Treat with rectal steroids. Treat constipation with fiber and water, exercise.

## 2013-07-04 ENCOUNTER — Telehealth: Payer: Self-pay | Admitting: *Deleted

## 2013-07-04 DIAGNOSIS — K644 Residual hemorrhoidal skin tags: Secondary | ICD-10-CM

## 2013-07-04 NOTE — Telephone Encounter (Signed)
Patient notified as instructed by telephone.  Advised Shirlee Limerick or Bonita Quin would be calling him to set up his appointment.

## 2013-07-04 NOTE — Telephone Encounter (Signed)
Left message for patient to return my call.

## 2013-07-04 NOTE — Telephone Encounter (Signed)
Received call from [facility] stating that his hemorrhoids feel somewhat better but he is still having discomfort with BM's.  Didn't know if he should follow up here or does he need a referral for GI.  Will forward to Dr. Ermalene Searing for review.

## 2013-07-04 NOTE — Telephone Encounter (Signed)
I would refer to Dr. Leone Payor GI.Marland Kitchen

## 2013-07-07 ENCOUNTER — Encounter: Payer: Self-pay | Admitting: Internal Medicine

## 2013-08-06 ENCOUNTER — Encounter: Payer: Self-pay | Admitting: *Deleted

## 2013-08-11 ENCOUNTER — Ambulatory Visit (INDEPENDENT_AMBULATORY_CARE_PROVIDER_SITE_OTHER): Payer: 59 | Admitting: Internal Medicine

## 2013-08-11 ENCOUNTER — Encounter: Payer: Self-pay | Admitting: Internal Medicine

## 2013-08-11 VITALS — BP 120/86 | HR 72 | Ht 71.0 in | Wt 211.4 lb

## 2013-08-11 DIAGNOSIS — K589 Irritable bowel syndrome without diarrhea: Secondary | ICD-10-CM | POA: Insufficient documentation

## 2013-08-11 DIAGNOSIS — K648 Other hemorrhoids: Secondary | ICD-10-CM | POA: Insufficient documentation

## 2013-08-11 NOTE — Progress Notes (Signed)
Referred by: Excell Seltzer, MD  Subjective:    Patient ID: Raymond Lloyd, male    DOB: 01/28/1971, 42 y.o.   MRN: [id number]  HPI The patient is a very pleasant middle-aged white man with a history of diarrhea predominant irritable bowel syndrome. For many years he's had several loose bowel movements in the morning before lunch. 4-5. Usually controllable though sometimes if he eats greasy foods like amber versus certain restaurants he will need to find a bathroom in about 15 minutes to defecate. He also has swollen hemorrhoids at times it lead on occasion, and have rarely prolapsed, and had been treated with hydrocortisone suppositories. At one point it sounds like he's had a thrombosed hemorrhoid treated with excision of clot in the past. He was seen recently and prescribed hydrocortisone suppositories and he was improved but he had some persistent irritation and discomfort so he was referred for further evaluation. He will also have mucus discharge from the hemorrhoids at times and some itching. He denies prolonged periods of time on the commode. He has not used fiber supplements.  He does also notice that carbonated drinks, milk ice cream and dairy products will lead to loose stools. He has tried probiotic agents, and he felt improved although well green variety but then another variety he felt constipated which causes hemorrhoids the flare.  His GI review of systems is otherwise negative. Review of Systems This is positive for those things mentiones in the HPI.. All other review of systems are negative.      Objective:   Physical Exam General:  Well-developed, well-nourished and in no acute distress Eyes:  anicteric. Lungs: Clear to auscultation bilaterally. Heart:  S1S2, no rubs, murmurs, gallops. Abdomen:  soft, non-tender, no hepatosplenomegaly, hernia, or mass and BS+.  Rectal: Male staff present  Anoderm inspection revealed normal anoderm Digital exam revealed slightly   increased resting tone  No mass present.   Anoscopy was performed with the patient in the left lateral decubitus position while a chaperone was present and revealed Grade 1-2 inflamed internal hemorrhoids most prominent in the left lateral position     Extremities:   no edema Skin   no rash. Neuro:  A&O x 3.  Psych:  appropriate mood and  Affect.   Data Reviewed: Primary care notes September 2014   PROCEDURE NOTE: The patient presents with symptomatic grade 2  hemorrhoids with bleeding, mucus discharge, prolapse, requesting rubber band ligation of his/her hemorrhoidal disease.  All risks, benefits and alternative forms of therapy were described and informed consent was obtained.   The anorectum was pre-medicated with 0.125% NTG and 5% lidocaine The decision was made to band the LL internal hemorrhoid, and the [facility] System was used to perform band ligation without complication.  Digital anorectal examination was then performed to assure proper positioning of the band, and to adjust the banded tissue as required.  The patient was discharged home without pain or other issues.  Dietary and behavioral recommendations were given and along with follow-up instructions.     The following adjunctive treatments were recommended:  Benefiber  The patient will return in 2-3 weeks  for  follow-up and possible additional banding as required. No complications were encountered and the patient tolerated the procedure well.      Assessment & Plan:   1. Hemorrhoids, internal, with bleeding   2. IBS (irritable bowel syndrome)    I appreciate the opportunity to care for this patient. CC: Kerby Nora, MD

## 2013-08-11 NOTE — Assessment & Plan Note (Signed)
Trial of probiotic and Benefiber - start Benefiber first and add in probiotic.

## 2013-08-11 NOTE — Assessment & Plan Note (Signed)
LL ligated To start Benefiber 1 tbsp  RTC 2-3 months

## 2013-08-11 NOTE — Patient Instructions (Signed)
HEMORRHOID BANDING PROCEDURE    FOLLOW-UP CARE   1. The procedure you have had should have been relatively painless since the banding of the area involved does not have nerve endings and there is no pain sensation.  The rubber band cuts off the blood supply to the hemorrhoid and the band may fall off as soon as 48 hours after the banding (the band may occasionally be seen in the toilet bowl following a bowel movement). You may notice a temporary feeling of fullness in the rectum which should respond adequately to plain Tylenol or Motrin.  2. Following the banding, avoid strenuous exercise that evening and resume full activity the next day.  A sitz bath (soaking in a warm tub) or bidet is soothing, and can be useful for cleansing the area after bowel movements.     3. To avoid constipation, take two tablespoons of natural wheat bran, natural oat bran, flax, Benefiber or any over the counter fiber supplement and increase your water intake to 7-8 glasses daily.    4. Unless you have been prescribed anorectal medication, do not put anything inside your rectum for two weeks: No suppositories, enemas, fingers, etc.  5. Occasionally, you may have more bleeding than usual after the banding procedure.  This is often from the untreated hemorrhoids rather than the treated one.  Don't be concerned if there is a tablespoon or so of blood.  If there is more blood than this, lie flat with your bottom higher than your head and apply an ice pack to the area. If the bleeding does not stop within a half an hour or if you feel faint, call our office at [phone] or go to the emergency room.  6. Problems are not common; however, if there is a substantial amount of bleeding, severe pain, chills, fever or difficulty passing urine (very rare) or other problems, you should call us at [phone] or report to the nearest emergency room.  7. Do not stay seated continuously for more than 2-3 hours for a day or two  after the procedure.  Tighten your buttock muscles 10-15 times every two hours and take 10-15 deep breaths every 1-2 hours.  Do not spend more than a few minutes on the toilet if you cannot empty your bowel; instead re-visit the toilet at a later time.    Today we are providing you with a benefiber handout, start out with 1 tablespoon and work up to 2 tablespoons as needed.  Use the Benefiber first and then add in a probiotic, coupon given for Align.  Handout on IBS given to read and follow.  We will see you back in 3 weeks or so for a follow up.  I appreciate the opportunity to care for you.

## 2013-08-14 ENCOUNTER — Other Ambulatory Visit: Payer: Self-pay

## 2013-09-01 ENCOUNTER — Encounter: Payer: Self-pay | Admitting: Internal Medicine

## 2013-09-01 ENCOUNTER — Ambulatory Visit (INDEPENDENT_AMBULATORY_CARE_PROVIDER_SITE_OTHER): Payer: 59 | Admitting: Internal Medicine

## 2013-09-01 VITALS — BP 110/80 | HR 72 | Ht 71.0 in | Wt 213.6 lb

## 2013-09-01 DIAGNOSIS — K648 Other hemorrhoids: Secondary | ICD-10-CM

## 2013-09-01 NOTE — Progress Notes (Signed)
PROCEDURE NOTE: The patient presents with symptomatic grade 1-2  hemorrhoids, requesting rubber band ligation of his/her hemorrhoidal disease.  All risks, benefits and alternative forms of therapy were described and informed consent was obtained. Prior LL column banding. Patient understoold slightly higher risk of bleeding and complications with placement of 2 bands and desired placement of 2 today.   The anorectum was pre-medicated with 0.125% NTG ointment and 5% lidocaine cream The decision was made to band the RA and RP internal hemorrhoids, and the [facility] System was used to perform band ligation without complication.  Digital anorectal examination was then performed to assure proper positioning of the band, and to adjust the banded tissue as required.  The patient was discharged home without pain or other issues.  Dietary and behavioral recommendations were given and along with follow-up instructions.     The following adjunctive treatments were recommended:  Continue Benefiber 1 tbsp daily  The patient will return in 2 months for  follow-up and possible additional banding as required. No complications were encountered and the patient tolerated the procedure well.

## 2013-09-01 NOTE — Assessment & Plan Note (Signed)
Doing well, less irritation and no bleeding since band came off after first banding. Significantly improved.  RA and RP ligated today Continue benefiber RTC 2 months

## 2013-09-01 NOTE — Patient Instructions (Signed)
HEMORRHOID BANDING PROCEDURE    FOLLOW-UP CARE   1. The procedure you have had should have been relatively painless since the banding of the area involved does not have nerve endings and there is no pain sensation.  The rubber band cuts off the blood supply to the hemorrhoid and the band may fall off as soon as 48 hours after the banding (the band may occasionally be seen in the toilet bowl following a bowel movement). You may notice a temporary feeling of fullness in the rectum which should respond adequately to plain Tylenol or Motrin.  2. Following the banding, avoid strenuous exercise that evening and resume full activity the next day.  A sitz bath (soaking in a warm tub) or bidet is soothing, and can be useful for cleansing the area after bowel movements.     3. To avoid constipation, take two tablespoons of natural wheat bran, natural oat bran, flax, Benefiber or any over the counter fiber supplement and increase your water intake to 7-8 glasses daily.    4. Unless you have been prescribed anorectal medication, do not put anything inside your rectum for two weeks: No suppositories, enemas, fingers, etc.  5. Occasionally, you may have more bleeding than usual after the banding procedure.  This is often from the untreated hemorrhoids rather than the treated one.  Don't be concerned if there is a tablespoon or so of blood.  If there is more blood than this, lie flat with your bottom higher than your head and apply an ice pack to the area. If the bleeding does not stop within a half an hour or if you feel faint, call our office at [phone] or go to the emergency room.  6. Problems are not common; however, if there is a substantial amount of bleeding, severe pain, chills, fever or difficulty passing urine (very rare) or other problems, you should call us at [phone] or report to the nearest emergency room.  7. Do not stay seated continuously for more than 2-3 hours for a day or two  after the procedure.  Tighten your buttock muscles 10-15 times every two hours and take 10-15 deep breaths every 1-2 hours.  Do not spend more than a few minutes on the toilet if you cannot empty your bowel; instead re-visit the toilet at a later time.    Follow up with [place] in January 2015.  Continue your benefiber.   I appreciate the opportunity to care for you.

## 2013-11-04 ENCOUNTER — Ambulatory Visit (INDEPENDENT_AMBULATORY_CARE_PROVIDER_SITE_OTHER): Payer: 59 | Admitting: Internal Medicine

## 2013-11-04 ENCOUNTER — Encounter: Payer: Self-pay | Admitting: Internal Medicine

## 2013-11-04 ENCOUNTER — Other Ambulatory Visit (INDEPENDENT_AMBULATORY_CARE_PROVIDER_SITE_OTHER): Payer: 59

## 2013-11-04 VITALS — BP 120/80 | HR 78 | Ht 71.0 in | Wt 210.0 lb

## 2013-11-04 DIAGNOSIS — K625 Hemorrhage of anus and rectum: Secondary | ICD-10-CM

## 2013-11-04 DIAGNOSIS — R197 Diarrhea, unspecified: Secondary | ICD-10-CM

## 2013-11-04 DIAGNOSIS — K648 Other hemorrhoids: Secondary | ICD-10-CM

## 2013-11-04 DIAGNOSIS — K589 Irritable bowel syndrome without diarrhea: Secondary | ICD-10-CM

## 2013-11-04 LAB — CBC WITH DIFFERENTIAL/PLATELET
BASOS PCT: 0.5 % (ref 0.0–3.0)
Basophils Absolute: 0 10*3/uL (ref 0.0–0.1)
EOS ABS: 0 10*3/uL (ref 0.0–0.7)
EOS PCT: 0.9 % (ref 0.0–5.0)
HEMATOCRIT: 40.8 % (ref 39.0–52.0)
Hemoglobin: 14 g/dL (ref 13.0–17.0)
LYMPHS ABS: 2.2 10*3/uL (ref 0.7–4.0)
Lymphocytes Relative: 40.1 % (ref 12.0–46.0)
MCHC: 34.4 g/dL (ref 30.0–36.0)
MCV: 87.9 fl (ref 78.0–100.0)
MONO ABS: 0.4 10*3/uL (ref 0.1–1.0)
MONOS PCT: 7.4 % (ref 3.0–12.0)
NEUTROS PCT: 51.1 % (ref 43.0–77.0)
Neutro Abs: 2.8 10*3/uL (ref 1.4–7.7)
Platelets: 190 10*3/uL (ref 150.0–400.0)
RBC: 4.64 Mil/uL (ref 4.22–5.81)
RDW: 12.9 % (ref 11.5–14.6)
WBC: 5.5 10*3/uL (ref 4.5–10.5)

## 2013-11-04 LAB — C-REACTIVE PROTEIN: CRP: 0.8 mg/dL (ref 0.5–20.0)

## 2013-11-04 LAB — IGA: IgA: 322 mg/dL (ref 68–378)

## 2013-11-04 MED ORDER — NA SULFATE-K SULFATE-MG SULF 17.5-3.13-1.6 GM/177ML PO SOLN: ORAL | Status: DC

## 2013-11-04 NOTE — Patient Instructions (Signed)
Your physician has requested that you go to the basement for the following lab work before leaving today: CBC/diff, C-reactive protein, TTG, IGA  You have been scheduled for a colonoscopy with propofol. Please follow written instructions given to you at your visit today.  Please pick up your prep kit at the pharmacy within the next 1-3 days. If you use inhalers (even only as needed), please bring them with you on the day of your procedure. Your physician has requested that you go to www.startemmi.com and enter the access code given to you at your visit today. This web site gives a general overview about your procedure. However, you should still follow specific instructions given to you by our office regarding your preparation for the procedure.  I appreciate the opportunity to care for you.

## 2013-11-04 NOTE — Assessment & Plan Note (Signed)
Statistically likely but will evaluate with celiac antibodies, colonoscopy, CBC, CRP.

## 2013-11-04 NOTE — Assessment & Plan Note (Addendum)
"  60% better" - may need additional banding but underlying bowel habit issues with frequent loose and urgent stools remains a problem - further diagnostic evaluation of that first.

## 2013-11-04 NOTE — Progress Notes (Signed)
         Subjective:    Patient ID: Raymond Lloyd, male    DOB: 11-15-1970, 43 y.o.   MRN: [id number]  HPI here for f/u about 2 months after hemorrhoid banding. Also has a dx of diarrhea-predominant IBS. Still having urgent and loose defecation despite Benefiber supplementation. Also still having bright red blood on the toilet paper. Says hemorrhoid sxs are 60% better.  Medications, allergies, past medical history, past surgical history, family history and social history are reviewed and updated in the EMR.   Review of Systems As above    Objective:   Physical Exam WDWN WM NAD    Assessment & Plan:  Diarrhea  Rectal bleeding  Hemorrhoids, internal, with bleeding and Grade 2 prolapse

## 2013-11-04 NOTE — Assessment & Plan Note (Signed)
Suspect this is from IBS but not confident enough - will evaluate with celiac Abs, CBC, CRP and a diagnostic colonoscopy. The risks and benefits as well as alternatives of endoscopic procedure(s) have been discussed and reviewed. All questions answered. The patient agrees to proceed.

## 2013-11-05 LAB — TISSUE TRANSGLUTAMINASE, IGA: Tissue Transglutaminase Ab, IgA: 13 U/mL (ref <20)

## 2013-11-06 NOTE — Progress Notes (Signed)
Quick Note:  Negative for celiac disease ______

## 2013-11-24 ENCOUNTER — Telehealth: Payer: Self-pay | Admitting: Internal Medicine

## 2013-11-24 NOTE — Telephone Encounter (Signed)
Patient advised via VM that he may have a light breakfast tomorrow am then clear liquids.  He is asked to call back for any additional questions or concerns

## 2013-11-26 ENCOUNTER — Encounter: Payer: Self-pay | Admitting: Internal Medicine

## 2013-11-26 ENCOUNTER — Ambulatory Visit (AMBULATORY_SURGERY_CENTER): Payer: 59 | Admitting: Internal Medicine

## 2013-11-26 VITALS — BP 121/78 | HR 58 | Temp 97.9°F | Resp 14 | Ht 71.0 in | Wt 210.0 lb

## 2013-11-26 DIAGNOSIS — K648 Other hemorrhoids: Secondary | ICD-10-CM

## 2013-11-26 DIAGNOSIS — D126 Benign neoplasm of colon, unspecified: Secondary | ICD-10-CM

## 2013-11-26 DIAGNOSIS — K625 Hemorrhage of anus and rectum: Secondary | ICD-10-CM

## 2013-11-26 DIAGNOSIS — R197 Diarrhea, unspecified: Secondary | ICD-10-CM

## 2013-11-26 MED ORDER — SODIUM CHLORIDE 0.9 % IV SOLN
500.0000 mL | INTRAVENOUS | Status: DC
Start: 2013-11-26 — End: 2013-11-26

## 2013-11-26 NOTE — Patient Instructions (Addendum)
I found and removed one tiny polyp. The colon lining looked normal but I took biopsies to be sure and to see if there is something known as microscopic colitis.  It looks like the left side and the right posterior  hemorrhoids are well treated but not the right anterior.  I will call the biopsy results and plans to you by next week.  I appreciate the opportunity to care for you. Gatha Mayer, MD, FACG   YOU HAD AN ENDOSCOPIC PROCEDURE TODAY AT [facility]: Refer to the procedure report that was given to you for any specific questions about what was found during the examination.  If the procedure report does not answer your questions, please call your gastroenterologist to clarify.  If you requested that your care partner not be given the details of your procedure findings, then the procedure report has been included in a sealed envelope for you to review at your convenience later.  YOU SHOULD EXPECT: Some feelings of bloating in the abdomen. Passage of more gas than usual.  Walking can help get rid of the air that was put into your GI tract during the procedure and reduce the bloating. If you had a lower endoscopy (such as a colonoscopy or flexible sigmoidoscopy) you may notice spotting of blood in your stool or on the toilet paper. If you underwent a bowel prep for your procedure, then you may not have a normal bowel movement for a few days.  DIET: Your first meal following the procedure should be a light meal and then it is ok to progress to your normal diet.  A half-sandwich or bowl of soup is an example of a good first meal.  Heavy or fried foods are harder to digest and may make you feel nauseous or bloated.  Likewise meals heavy in dairy and vegetables can cause extra gas to form and this can also increase the bloating.  Drink plenty of fluids but you should avoid alcoholic beverages for 24 hours.  ACTIVITY: Your care partner should take you home directly after the  procedure.  You should plan to take it easy, moving slowly for the rest of the day.  You can resume normal activity the day after the procedure however you should NOT DRIVE or use heavy machinery for 24 hours (because of the sedation medicines used during the test).    SYMPTOMS TO REPORT IMMEDIATELY: A gastroenterologist can be reached at any hour.  During normal business hours, 8:30 AM to 5:00 PM Monday through Friday, call [phone].  After hours and on weekends, please call the GI answering service at [phone] who will take a message and have the physician on call contact you.   Following lower endoscopy (colonoscopy or flexible sigmoidoscopy):  Excessive amounts of blood in the stool  Significant tenderness or worsening of abdominal pains  Swelling of the abdomen that is new, acute  Fever of 100F or higher  Following upper endoscopy (EGD)  Vomiting of blood or coffee ground material  New chest pain or pain under the shoulder blades  Painful or persistently difficult swallowing  New shortness of breath  Fever of 100F or higher  Black, tarry-looking stools  FOLLOW UP: If any biopsies were taken you will be contacted by phone or by letter within the next 1-3 weeks.  Call your gastroenterologist if you have not heard about the biopsies in 3 weeks.  Our staff will call the home number listed on your records the  next business day following your procedure to check on you and address any questions or concerns that you may have at that time regarding the information given to you following your procedure. This is a courtesy call and so if there is no answer at the home number and we have not heard from you through the emergency physician on call, we will assume that you have returned to your regular daily activities without incident.  SIGNATURES/CONFIDENTIALITY: You and/or your care partner have signed paperwork which will be entered into your electronic medical record.  These  signatures attest to the fact that that the information above on your After Visit Summary has been reviewed and is understood.  Full responsibility of the confidentiality of this discharge information lies with you and/or your care-partner.YOU HAD AN ENDOSCOPIC PROCEDURE TODAY AT [facility]: Refer to the procedure report that was given to you for any specific questions about what was found during the examination.  If the procedure report does not answer your questions, please call your gastroenterologist to clarify.  If you requested that your care partner not be given the details of your procedure findings, then the procedure report has been included in a sealed envelope for you to review at your convenience later.  YOU SHOULD EXPECT: Some feelings of bloating in the abdomen. Passage of more gas than usual.  Walking can help get rid of the air that was put into your GI tract during the procedure and reduce the bloating. If you had a lower endoscopy (such as a colonoscopy or flexible sigmoidoscopy) you may notice spotting of blood in your stool or on the toilet paper. If you underwent a bowel prep for your procedure, then you may not have a normal bowel movement for a few days.  DIET: Your first meal following the procedure should be a light meal and then it is ok to progress to your normal diet.  A half-sandwich or bowl of soup is an example of a good first meal.  Heavy or fried foods are harder to digest and may make you feel nauseous or bloated.  Likewise meals heavy in dairy and vegetables can cause extra gas to form and this can also increase the bloating.  Drink plenty of fluids but you should avoid alcoholic beverages for 24 hours.  ACTIVITY: Your care partner should take you home directly after the procedure.  You should plan to take it easy, moving slowly for the rest of the day.  You can resume normal activity the day after the procedure however you should NOT DRIVE or use heavy  machinery for 24 hours (because of the sedation medicines used during the test).    SYMPTOMS TO REPORT IMMEDIATELY: A gastroenterologist can be reached at any hour.  During normal business hours, 8:30 AM to 5:00 PM Monday through Friday, call [phone].  After hours and on weekends, please call the GI answering service at [phone] who will take a message and have the physician on call contact you.   Following lower endoscopy (colonoscopy or flexible sigmoidoscopy):  Excessive amounts of blood in the stool  Significant tenderness or worsening of abdominal pains  Swelling of the abdomen that is new, acute  Fever of 100F or higher   FOLLOW UP: If any biopsies were taken you will be contacted by phone or by letter within the next 1-3 weeks.  Call your gastroenterologist if you have not heard about the biopsies in 3 weeks.  Our staff will  call the home number listed on your records the next business day following your procedure to check on you and address any questions or concerns that you may have at that time regarding the information given to you following your procedure. This is a courtesy call and so if there is no answer at the home number and we have not heard from you through the emergency physician on call, we will assume that you have returned to your regular daily activities without incident.  SIGNATURES/CONFIDENTIALITY: You and/or your care partner have signed paperwork which will be entered into your electronic medical record.  These signatures attest to the fact that that the information above on your After Visit Summary has been reviewed and is understood.  Full responsibility of the confidentiality of this discharge information lies with you and/or your care-partner.   Information on polyps and hemorrhoids given to you today

## 2013-11-26 NOTE — Op Note (Signed)
[place]  [address] [place], [address]   COLONOSCOPY PROCEDURE REPORT  PATIENT: Raymond Lloyd, Raymond Lloyd  MR#: [id number] BIRTHDATE: 29-Apr-1971 , 42  yrs. old GENDER: Male ENDOSCOPIST: Gatha Mayer, MD, [facility] PROCEDURE DATE:  11/26/2013 PROCEDURE:   Colonoscopy with biopsy First Screening Colonoscopy - Avg.  risk and is 50 yrs.  old or older - No.  Prior Negative Screening - Now for repeat screening. N/A  History of Adenoma - Now for follow-up colonoscopy & has been > or = to 3 yrs.  N/A  Polyps Removed Today? Yes. ASA CLASS:   Class II INDICATIONS:chronic diarrhea. MEDICATIONS: propofol (Diprivan) 300mg  IV, MAC sedation, administered by CRNA, and These medications were titrated to patient response per physician's verbal order  DESCRIPTION OF PROCEDURE:   After the risks benefits and alternatives of the procedure were thoroughly explained, informed consent was obtained.  A digital rectal exam revealed no abnormalities of the rectum, A digital rectal exam revealed no prostatic nodules, and A digital rectal exam revealed the prostate was not enlarged.   The [id number]  endoscope was introduced through the anus and advanced to the terminal ileum which was intubated for a short distance. No adverse events experienced.   The quality of the prep was excellent using Suprep The instrument was then slowly withdrawn as the colon was fully examined.  COLON FINDINGS: A sessile polyp measuring 2 mm in size was found in the sigmoid colon.  A polypectomy was performed with cold forceps. The resection was complete and the polyp tissue was completely retrieved.   The colon mucosa was otherwise normal - multiple random biopsies taken and sent to pathology.  The mucosa appeared normal in the terminal ileum.  Retroflexed views revealed internal hemorrhoids. The time to cecum=1 minutes 18 seconds.  Withdrawal time=6 minutes 17 seconds.  The scope was withdrawn and  the procedure completed. COMPLICATIONS: There were no complications.  ENDOSCOPIC IMPRESSION: 1.   Sessile polyp measuring 2 mm in size was found in the sigmoid colon; polypectomy was performed with cold forceps 2.   The colon mucosa was otherwise normal - excellent prep - random biopsies taken 3.   Normal mucosa in the terminal ileum 4.   Internal hemorrhoids 5.   Right anterior and left lateral hemorrhoid banding scars  RECOMMENDATIONS: Office will call with the results.   eSigned:  Gatha Mayer, MD, [facility] 11/26/2013 3:14 PM  cc: The Patient

## 2013-11-26 NOTE — Progress Notes (Signed)
Called to room to assist during endoscopic procedure.  Patient ID and intended procedure confirmed with present staff. Received instructions for my participation in the procedure from the performing physician.  

## 2013-11-27 ENCOUNTER — Telehealth: Payer: Self-pay | Admitting: *Deleted

## 2013-11-27 NOTE — Telephone Encounter (Signed)
  Follow up Call-  Call back number 11/26/2013  Post procedure Call Back phone  # [phone]  Permission to leave phone message Yes     Patient questions:  Do you have a fever, pain , or abdominal swelling? no Pain Score  0 *  Have you tolerated food without any problems? yes  Have you been able to return to your normal activities? yes  Do you have any questions about your discharge instructions: Diet   no Medications  no Follow up visit  no  Do you have questions or concerns about your Care? no  Actions: * If pain score is 4 or above: No action needed, pain <4.

## 2013-12-01 NOTE — Progress Notes (Signed)
Quick Note:  Colonoscopy recall in month and year of 51th birthday ______

## 2013-12-01 NOTE — Progress Notes (Signed)
Quick Note:  Biopsies do not show any problems and polyp not pre-cancerous  Ask him to arrange an REV at his convenience re: IBS, hemorrhoids (may band again if desired)  Have him try 12-1 loperamide 2 mg daily to see if that improves bowel habits ______

## 2013-12-02 ENCOUNTER — Telehealth: Payer: Self-pay | Admitting: Internal Medicine

## 2013-12-02 NOTE — Telephone Encounter (Signed)
All questions answered. Recall placed for 08/2022.  Office visit scheduled for 01/20/14

## 2013-12-18 ENCOUNTER — Ambulatory Visit (INDEPENDENT_AMBULATORY_CARE_PROVIDER_SITE_OTHER): Payer: 59 | Admitting: Family Medicine

## 2013-12-18 ENCOUNTER — Encounter: Payer: Self-pay | Admitting: Family Medicine

## 2013-12-18 VITALS — BP 120/84 | HR 64 | Temp 98.1°F | Ht 71.0 in | Wt 209.2 lb

## 2013-12-18 DIAGNOSIS — H6980 Other specified disorders of Eustachian tube, unspecified ear: Secondary | ICD-10-CM

## 2013-12-18 DIAGNOSIS — J309 Allergic rhinitis, unspecified: Secondary | ICD-10-CM

## 2013-12-18 MED ORDER — FLUTICASONE PROPIONATE 50 MCG/ACT NA SUSP: 2.0000 | Freq: Every day | NASAL | Status: DC

## 2013-12-18 NOTE — Progress Notes (Signed)
Pre visit review using our clinic review tool, if applicable. No additional management support is needed unless otherwise documented below in the visit note. 

## 2013-12-18 NOTE — Assessment & Plan Note (Signed)
No sign of infeciton. Treat with nasal steroid, if not improving consider oral steroid.  Discussed time limit on afrin.

## 2013-12-18 NOTE — Patient Instructions (Signed)
Complete 3 days of afrin. Start nasal steroid spray 2 spray daily Continue claritin D. Call if not improving as expected in 1-2 weeks.

## 2013-12-18 NOTE — Assessment & Plan Note (Signed)
Treat with nasal steroid and antihistamine.

## 2013-12-18 NOTE — Progress Notes (Signed)
   Subjective:    Patient ID: Raymond Lloyd, male    DOB: 1971-07-25, 43 y.o.   MRN: [id number]  Otalgia  There is pain in the left ear. This is a new problem. The current episode started in the past 7 days (3 days). The problem occurs constantly. The problem has been gradually worsening. Associated symptoms include coughing, drainage, ear discharge and a sore throat. Pertinent negatives include no headaches, rash or rhinorrhea. Associated symptoms comments: Cold sore, nasal congestion. Treatments tried: nasal afrin. The treatment provided moderate (afrin improved congestion) relief. There is no history of a chronic ear infection, hearing loss or a tympanostomy tube.    Allergies, moderate control with claritin D.  Wants to know what he can use instead of afrin long term.   Review of Systems  HENT: Positive for ear discharge, ear pain and sore throat. Negative for rhinorrhea.   Respiratory: Positive for cough.   Skin: Negative for rash.  Neurological: Negative for headaches.       Objective:   Physical Exam  Constitutional: Vital signs are normal. He appears well-developed and well-nourished.  Non-toxic appearance. He does not appear ill. No distress.  HENT:  Head: Normocephalic and atraumatic.  Right Ear: Hearing, external ear and ear canal normal. No tenderness. No foreign bodies. Tympanic membrane is not retracted and not bulging. A middle ear effusion is present.  Left Ear: Hearing, external ear and ear canal normal. No tenderness. No foreign bodies. Tympanic membrane is not retracted and not bulging. A middle ear effusion is present.  Nose: Nose normal. No mucosal edema or rhinorrhea. Right sinus exhibits no maxillary sinus tenderness and no frontal sinus tenderness. Left sinus exhibits no maxillary sinus tenderness and no frontal sinus tenderness.  Mouth/Throat: Uvula is midline, oropharynx is clear and moist and mucous membranes are normal. Normal dentition. No dental caries. No  oropharyngeal exudate or tonsillar abscesses.  Eyes: Conjunctivae, EOM and lids are normal. Pupils are equal, round, and reactive to light. Lids are everted and swept, no foreign bodies found.  Neck: Trachea normal, normal range of motion and phonation normal. Neck supple. Carotid bruit is not present. No mass and no thyromegaly present.  Cardiovascular: Normal rate, regular rhythm, S1 normal, S2 normal, normal heart sounds, intact distal pulses and normal pulses.  Exam reveals no gallop.   No murmur heard. Pulmonary/Chest: Effort normal and breath sounds normal. No respiratory distress. He has no wheezes. He has no rhonchi. He has no rales.  Abdominal: Soft. Normal appearance and bowel sounds are normal. There is no hepatosplenomegaly. There is no tenderness. There is no rebound, no guarding and no CVA tenderness. No hernia.  Neurological: He is alert. He has normal reflexes.  Skin: Skin is warm, dry and intact. No rash noted.  Psychiatric: He has a normal mood and affect. His speech is normal and behavior is normal. Judgment normal.          Assessment & Plan:

## 2014-01-20 ENCOUNTER — Ambulatory Visit (INDEPENDENT_AMBULATORY_CARE_PROVIDER_SITE_OTHER): Payer: 59 | Admitting: Internal Medicine

## 2014-01-20 ENCOUNTER — Encounter: Payer: Self-pay | Admitting: Internal Medicine

## 2014-01-20 VITALS — BP 120/84 | HR 80 | Ht 71.0 in | Wt 210.6 lb

## 2014-01-20 DIAGNOSIS — K589 Irritable bowel syndrome without diarrhea: Secondary | ICD-10-CM

## 2014-01-20 NOTE — Assessment & Plan Note (Addendum)
Stable to slightly improved. Currently using loperamide and probiotic and Benefiber RTC prn

## 2014-01-20 NOTE — Patient Instructions (Signed)
Stop the Benefiber and see how you do and also try to reduce fiber in your diet (Low Fiber Diet Handout). If it turns out Benefiber was helping add it back.  OK to continue the loperamide (Imodium) as needed.  I am not suggesting a regular follow-up but call back as needed.  I appreciate the opportunity to care for you. Gatha Mayer, MD, [facility]

## 2014-01-20 NOTE — Progress Notes (Signed)
         Subjective:    Patient ID: Raymond Lloyd, male    DOB: 04-23-1971, 43 y.o.   MRN: [id number]  HPI The patient is here after colonoscopy with random bxs was negative for colitis. Celiac testing negative also. Has diarrhea-IBS as dx.  Says down from 5-6 stools/day to 2-3. Occasional loperamide helps. Has noticed that some candy makes him stool (chocolate). Wondering if he needs Benefiber still. Hemorrhoids quiesecent - s/p banding.  Medications, allergies, past medical history, past surgical history, family history and social history are reviewed and updated in the EMR. Review of Systems As above    Objective:   Physical Exam WDWN NAD    Assessment & Plan:  IBS (irritable bowel syndrome)- diarrhea predminant Stable to slightly improved. Currently using loperamide and probiotic and Benefiber  RTC prn

## 2014-05-04 ENCOUNTER — Telehealth: Payer: Self-pay | Admitting: Family Medicine

## 2014-05-04 DIAGNOSIS — E781 Pure hyperglyceridemia: Secondary | ICD-10-CM

## 2014-05-04 NOTE — Telephone Encounter (Signed)
Message copied by Jinny Sanders on Mon May 04, 2014 10:30 PM ------      Message from: Ellamae Sia      Created: Wed Apr 29, 2014  3:20 PM      Regarding: Lab orders for Tuesday, 7.28.15       Patient is scheduled for CPX labs, please order future labs, Thanks , Terri       ------

## 2014-05-05 ENCOUNTER — Other Ambulatory Visit (INDEPENDENT_AMBULATORY_CARE_PROVIDER_SITE_OTHER): Payer: 59

## 2014-05-05 DIAGNOSIS — E781 Pure hyperglyceridemia: Secondary | ICD-10-CM

## 2014-05-05 LAB — COMPREHENSIVE METABOLIC PANEL
ALBUMIN: 3.7 g/dL (ref 3.5–5.2)
ALT: 28 U/L (ref 0–53)
AST: 19 U/L (ref 0–37)
Alkaline Phosphatase: 71 U/L (ref 39–117)
BUN: 13 mg/dL (ref 6–23)
CO2: 30 meq/L (ref 19–32)
Calcium: 8.7 mg/dL (ref 8.4–10.5)
Chloride: 107 mEq/L (ref 96–112)
Creatinine, Ser: 1.1 mg/dL (ref 0.4–1.5)
GFR: 78.14 mL/min (ref >60.00)
GLUCOSE: 104 mg/dL — AB (ref 70–99)
POTASSIUM: 4 meq/L (ref 3.5–5.1)
Sodium: 140 mEq/L (ref 135–145)
TOTAL PROTEIN: 6.5 g/dL (ref 6.0–8.3)
Total Bilirubin: 0.5 mg/dL (ref 0.2–1.2)

## 2014-05-05 LAB — LIPID PANEL
CHOLESTEROL: 193 mg/dL (ref 0–200)
HDL: 37.9 mg/dL — ABNORMAL LOW (ref >39.00)
NonHDL: 155.1
TRIGLYCERIDES: 259 mg/dL — AB (ref 0.0–149.0)
Total CHOL/HDL Ratio: 5
VLDL: 51.8 mg/dL — ABNORMAL HIGH (ref 0.0–40.0)

## 2014-05-05 LAB — LDL CHOLESTEROL, DIRECT: Direct LDL: 95 mg/dL

## 2014-05-12 ENCOUNTER — Encounter: Payer: Self-pay | Admitting: Family Medicine

## 2014-05-12 ENCOUNTER — Ambulatory Visit (INDEPENDENT_AMBULATORY_CARE_PROVIDER_SITE_OTHER): Payer: 59 | Admitting: Family Medicine

## 2014-05-12 VITALS — BP 104/74 | HR 68 | Temp 98.3°F | Ht 71.0 in | Wt 202.5 lb

## 2014-05-12 DIAGNOSIS — R7303 Prediabetes: Secondary | ICD-10-CM

## 2014-05-12 DIAGNOSIS — R7309 Other abnormal glucose: Secondary | ICD-10-CM

## 2014-05-12 NOTE — Patient Instructions (Addendum)
Work on [employer], ie bread, pasta potatos, sweets, juice, soda. Get back on track with exercise.   Food Choices to Lower Your Triglycerides  Triglycerides are a type of fat in your blood. High levels of triglycerides can increase the risk of heart disease and stroke. If your triglyceride levels are high, the foods you eat and your eating habits are very important. Choosing the right foods can help lower your triglycerides.  WHAT GENERAL GUIDELINES DO I NEED TO FOLLOW?  Lose weight if you are overweight.   Limit or avoid alcohol.   Fill one half of your plate with vegetables and green salads.   Limit fruit to two servings a day. Choose fruit instead of juice.   Make one fourth of your plate whole grains. Look for the word "whole" as the first word in the ingredient list.  Fill one fourth of your plate with lean protein foods.  Enjoy fatty fish (such as salmon, mackerel, sardines, and tuna) three times a week.   Choose healthy fats.   Limit foods high in starch and sugar.  Eat more home-cooked food and less restaurant, buffet, and fast food.  Limit fried foods.  Cook foods using methods other than frying.  Limit saturated fats.  Check ingredient lists to avoid foods with partially hydrogenated oils (trans fats) in them. WHAT FOODS CAN I EAT?  Grains Whole grains, such as whole wheat or whole grain breads, crackers, cereals, and pasta. Unsweetened oatmeal, bulgur, barley, quinoa, or brown rice. Corn or whole wheat flour tortillas.  Vegetables Fresh or frozen vegetables (raw, steamed, roasted, or grilled). Green salads. Fruits All fresh, canned (in natural juice), or frozen fruits. Meat and Other Protein Products Ground beef (85% or leaner), grass-fed beef, or beef trimmed of fat. Skinless chicken or [place]. Ground chicken or [place]. Pork trimmed of fat. All fish and seafood. Eggs. Dried beans, peas, or lentils. Unsalted nuts or seeds. Unsalted canned or dry  beans. Dairy Low-fat dairy products, such as skim or 1% milk, 2% or reduced-fat cheeses, low-fat ricotta or cottage cheese, or plain low-fat yogurt. Fats and Oils Tub margarines without trans fats. Light or reduced-fat mayonnaise and salad dressings. Avocado. Safflower, olive, or canola oils. Natural peanut or almond butter. The items listed above may not be a complete list of recommended foods or beverages. Contact your dietitian for more options. WHAT FOODS ARE NOT RECOMMENDED?  Grains White bread. White pasta. White rice. Cornbread. Bagels, pastries, and croissants. Crackers that contain trans fat. Vegetables White potatoes. Corn. Creamed or fried vegetables. Vegetables in a cheese sauce. Fruits Dried fruits. Canned fruit in light or heavy syrup. Fruit juice. Meat and Other Protein Products Fatty cuts of meat. Ribs, chicken wings, bacon, sausage, bologna, salami, chitterlings, fatback, hot dogs, bratwurst, and packaged luncheon meats. Dairy Whole or 2% milk, cream, half-and-half, and cream cheese. Whole-fat or sweetened yogurt. Full-fat cheeses. Nondairy creamers and whipped toppings. Processed cheese, cheese spreads, or cheese curds. Sweets and Desserts Corn syrup, sugars, honey, and molasses. Candy. Jam and jelly. Syrup. Sweetened cereals. Cookies, pies, cakes, donuts, muffins, and ice cream. Fats and Oils Butter, stick margarine, lard, shortening, ghee, or bacon fat. Coconut, palm kernel, or palm oils. Beverages Alcohol. Sweetened drinks (such as sodas, lemonade, and fruit drinks or punches). The items listed above may not be a complete list of foods and beverages to avoid. Contact your dietitian for more information. Document Released: 07/13/2004 Document Revised: 09/30/2013 Document Reviewed: 07/30/2013 [facility] Patient Information 2015 [place], [place]. This  information is not intended to replace advice given to you by your health care provider. Make sure you discuss any questions  you have with your health care provider.

## 2014-05-12 NOTE — Progress Notes (Signed)
Pre visit review using our clinic review tool, if applicable. No additional management support is needed unless otherwise documented below in the visit note. 

## 2014-05-12 NOTE — Progress Notes (Signed)
HPI  The patient is here for annual wellness exam and preventative care.   BP Readings from Last 3 Encounters:  05/12/14 104/74  01/20/14 120/84  12/18/13 120/84   He has been working 60-80 hours a week.  Exercise: Has not been able to exercise. Diet: poor lately. Wt Readings from Last 3 Encounters:  05/12/14 202 lb 8 oz (91.853 kg)  01/20/14 210 lb 9.6 oz (95.528 kg)  12/18/13 209 lb 4 oz (94.915 kg)   New diagnosis prediabetes.  Reviewed labs in detail with pt.   Hypertriglycerides... Remains elevated despite fish oil 3600 mg daily... Turns out he is not taking regularly.  LDL at goal. Lab Results  Component Value Date   CHOL 193 05/05/2014   HDL 37.90* 05/05/2014   LDLCALC 117* 03/29/2012   LDLDIRECT 95.0 05/05/2014   TRIG 259.0* 05/05/2014   CHOLHDL 5 05/05/2014    Review of Systems  Constitutional: Negative for fever, fatigue and unexpected weight change.  HENT: Negative for ear pain, congestion, sore throat, rhinorrhea, trouble swallowing and postnasal drip.  Eyes: Negative for pain.  Respiratory: Negative for cough, shortness of breath and wheezing.  Cardiovascular: Negative for chest pain, palpitations and leg swelling.  Gastrointestinal: Negative for nausea, abdominal pain, diarrhea, constipation and blood in stool.  Genitourinary: Negative for dysuria, urgency, hematuria, discharge, penile swelling, scrotal swelling, difficulty urinating, penile pain and testicular pain.  Skin:no rash Neurological: Negative for syncope, weakness, light-headedness, numbness and headaches.  Psychiatric/Behavioral: Negative for behavioral problems and dysphoric mood. The patient is not nervous/anxious.  Objective:   Physical Exam  Constitutional: He appears well-developed and well-nourished. Non-toxic appearance. He does not appear ill. No distress.  HENT:  Head: Normocephalic and atraumatic.  Right Ear: Hearing, tympanic membrane, external ear and ear canal normal.  Left Ear:  Hearing, tympanic membrane, external ear and ear canal normal.  Nose: Nose normal.  Mouth/Throat: Uvula is midline, oropharynx is clear and moist and mucous membranes are normal.  Eyes: Conjunctivae, EOM and lids are normal. Pupils are equal, round, and reactive to light. No foreign bodies found.  Neck: Trachea normal, normal range of motion and phonation normal. Neck supple. Carotid bruit is not present. No mass and no thyromegaly present.  Cardiovascular: Normal rate, regular rhythm, S1 normal, S2 normal, intact distal pulses and normal pulses. Exam reveals no gallop.  No murmur heard.  Pulmonary/Chest: Breath sounds normal. He has no wheezes. He has no rhonchi. He has no rales.  Abdominal: Soft. Normal appearance and bowel sounds are normal. There is no hepatosplenomegaly. There is no tenderness. There is no rebound, no guarding and no CVA tenderness. No hernia. Hernia confirmed negative in the right inguinal area and confirmed negative in the left inguinal area.  Genitourinary: Testes normal and penis normal. Right testis shows no mass and no tenderness. Left testis shows no mass and no tenderness. No paraphimosis or penile tenderness.  Lymphadenopathy:  He has no cervical adenopathy.  Right: No inguinal adenopathy present.  Left: No inguinal adenopathy present.  Neurological: He is alert. He has normal strength and normal reflexes. No cranial nerve deficit or sensory deficit. Gait normal.  Skin: Skin is warm, dry and intact. No rash noted.  Psychiatric: He has a normal mood and affect. His speech is normal and behavior is normal. Judgment normal.  Assessment & Plan:   The patient's preventative maintenance and recommended screening tests for an annual wellness exam were reviewed in full today.  Brought up to date unless services declined.  Counselled on the importance of diet, exercise, and its role in overall health and mortality.  The patient's FH and SH was reviewed, including their home  life, tobacco status, and drug and alcohol status.   Nonsmoker.  Tetanus uptodate.   Denies need for STD testing  No early colon or prostate cancer.

## 2014-08-16 ENCOUNTER — Other Ambulatory Visit: Payer: Self-pay | Admitting: Family Medicine

## 2014-09-22 ENCOUNTER — Encounter: Payer: Self-pay | Admitting: Internal Medicine

## 2014-09-22 ENCOUNTER — Ambulatory Visit (INDEPENDENT_AMBULATORY_CARE_PROVIDER_SITE_OTHER): Payer: 59 | Admitting: Internal Medicine

## 2014-09-22 VITALS — BP 120/80 | HR 89 | Temp 98.1°F | Resp 12 | Wt 199.0 lb

## 2014-09-22 DIAGNOSIS — J209 Acute bronchitis, unspecified: Secondary | ICD-10-CM | POA: Insufficient documentation

## 2014-09-22 MED ORDER — HYDROCODONE-HOMATROPINE 5-1.5 MG/5ML PO SYRP: 5.0000 mL | ORAL_SOLUTION | Freq: Every evening | ORAL | Status: DC | PRN

## 2014-09-22 MED ORDER — AMOXICILLIN 500 MG PO TABS: 1000.0000 mg | ORAL_TABLET | Freq: Two times a day (BID) | ORAL | Status: DC

## 2014-09-22 NOTE — Assessment & Plan Note (Signed)
Discussed that this is likely viral and may remit on its own Cough syrup to help sleep If worsens, will start the amoxil

## 2014-09-22 NOTE — Progress Notes (Signed)
Subjective:    Patient ID: Raymond Lloyd, male    DOB: 29-Oct-1970, 43 y.o.   MRN: [id number]  HPI Here for evaluation of respiratory symptoms  Started about a week ago Low grade fever--better yesterday Cough ---mucus caught in throat but occ gets out thick, dark brown Awakens burning and with drenching sweat  Head congestion and some headache No sig sore throat Some pain in right ear No nasal drainage of sig--may have some PND No SOB Cough worst last night--keeping him up  Trying dayquil and theraflu--- may have helped initially  Current Outpatient Prescriptions on File Prior to Visit  Medication Sig Dispense Refill  . fexofenadine (ALLEGRA) 180 MG tablet Take 180 mg by mouth daily.    . fluticasone (CUTIVATE) 0.005 % ointment Apply topically 2 (two) times daily as needed.     . fluticasone (FLONASE) 50 MCG/ACT nasal spray PLACE 2 SPRAYS INTO BOTH NOSTRILS DAILY. 16 g 5  . ketoconazole (NIZORAL) 2 % cream Apply 1 application topically 2 (two) times daily.    Marland Kitchen loperamide (IMODIUM) 2 MG capsule Take 2 mg by mouth 2 (two) times daily as needed.     . Multiple Vitamin (MULTIVITAMIN) tablet Take 1 tablet by mouth daily.      . Omega-3 Fatty Acids (FISH OIL PO) Take 1200 by mouth, two in am and one pm    . Probiotic Product (PROBIOTIC DAILY PO) Take 1 capsule by mouth daily.    . Wheat Dextrin (BENEFIBER) POWD Take by mouth as directed.     No current facility-administered medications on file prior to visit.    No Known Allergies  Past Medical History  Diagnosis Date  . History of eczema   . IBS (irritable bowel syndrome)   . Colon polyp     Past Surgical History  Procedure Laterality Date  . Pilonidal cyst excision  2007  . Vasectomy    . Minor hemorrhoidectomy      Thrombosed hemorrhoid treatment  . Hemorrhoid banding  2014    multiple  . Colonoscopy w/ biopsies      Family History  Problem Relation Age of Onset  . Hypertension Father   . Hyperlipidemia  Father   . Breast cancer Mother   . Cancer Paternal Grandfather     ?  Marland Kitchen Heart attack Paternal Grandfather 85    History   Social History  . Marital Status: Married    Spouse Name: N/A    Number of Children: 2  . Years of Education: N/A   Occupational History  . [employer] office    Social History Main Topics  . Smoking status: Never Smoker   . Smokeless tobacco: Former Systems developer  . Alcohol Use: Yes     Comment: Occasional  . Drug Use: No  . Sexual Activity: Yes    Birth Control/ Protection: None   Other Topics Concern  . Not on file   Social History Narrative   Occupation: Works for [employer] office, 12 hour shifts   Previously in [employer], [place]   Married   2 children-sons, healthy   Never Smoked   Alcohol use-yes, 0-6 per week   Drug use-no   Regular exercise-yes, 3-5 days per week   Diet: fast food, water            Review of Systems  No rash No vomiting Chronic diarrhea Appetite is off---weight down 4#     Objective:   Physical Exam  Constitutional: He appears well-developed and  well-nourished. No distress.  HENT:  No sinus tenderness Mild pharyngeal injection Mild nasal inflammation TMs normal  Neck: Normal range of motion. Neck supple. No thyromegaly present.  Pulmonary/Chest: Effort normal and breath sounds normal. No respiratory distress. He has no wheezes. He has no rales.  Lymphadenopathy:    He has no cervical adenopathy.          Assessment & Plan:

## 2014-09-22 NOTE — Progress Notes (Signed)
Pre visit review using our clinic review tool, if applicable. No additional management support is needed unless otherwise documented below in the visit note. 

## 2014-09-22 NOTE — Patient Instructions (Signed)
Please start the antibiotic if you are worsening in the next few days. 

## 2014-11-16 ENCOUNTER — Encounter: Payer: Self-pay | Admitting: Internal Medicine

## 2015-04-29 ENCOUNTER — Telehealth: Payer: Self-pay | Admitting: Family Medicine

## 2015-04-29 DIAGNOSIS — E781 Pure hyperglyceridemia: Secondary | ICD-10-CM

## 2015-04-29 DIAGNOSIS — R7303 Prediabetes: Secondary | ICD-10-CM

## 2015-04-29 NOTE — Telephone Encounter (Signed)
-----   Message from Ellamae Sia sent at 04/28/2015 11:35 AM EDT ----- Regarding: Lab orders for Friday, 7.29.16 Patient is scheduled for CPX labs, please order future labs, Thanks , Karna Christmas

## 2015-05-07 ENCOUNTER — Other Ambulatory Visit (INDEPENDENT_AMBULATORY_CARE_PROVIDER_SITE_OTHER): Payer: 59

## 2015-05-07 DIAGNOSIS — R7309 Other abnormal glucose: Secondary | ICD-10-CM

## 2015-05-07 DIAGNOSIS — R7303 Prediabetes: Secondary | ICD-10-CM

## 2015-05-07 DIAGNOSIS — E781 Pure hyperglyceridemia: Secondary | ICD-10-CM | POA: Diagnosis not present

## 2015-05-07 LAB — LIPID PANEL
Cholesterol: 227 mg/dL — ABNORMAL HIGH (ref 0–200)
HDL: 43.6 mg/dL (ref >39.00)
NonHDL: 183.67
TRIGLYCERIDES: 231 mg/dL — AB (ref 0.0–149.0)
Total CHOL/HDL Ratio: 5
VLDL: 46.2 mg/dL — ABNORMAL HIGH (ref 0.0–40.0)

## 2015-05-07 LAB — COMPREHENSIVE METABOLIC PANEL
ALT: 23 U/L (ref 0–53)
AST: 17 U/L (ref 0–37)
Albumin: 4.2 g/dL (ref 3.5–5.2)
Alkaline Phosphatase: 69 U/L (ref 39–117)
BUN: 14 mg/dL (ref 6–23)
CO2: 29 mEq/L (ref 19–32)
CREATININE: 1.03 mg/dL (ref 0.40–1.50)
Calcium: 9.2 mg/dL (ref 8.4–10.5)
Chloride: 104 mEq/L (ref 96–112)
GFR: 83.9 mL/min (ref >60.00)
Glucose, Bld: 93 mg/dL (ref 70–99)
Potassium: 4.1 mEq/L (ref 3.5–5.1)
Sodium: 140 mEq/L (ref 135–145)
Total Bilirubin: 0.5 mg/dL (ref 0.2–1.2)
Total Protein: 7.1 g/dL (ref 6.0–8.3)

## 2015-05-07 LAB — LDL CHOLESTEROL, DIRECT: Direct LDL: 124 mg/dL

## 2015-05-07 LAB — HEMOGLOBIN A1C: Hgb A1c MFr Bld: 5.3 % (ref 4.6–6.5)

## 2015-05-14 ENCOUNTER — Encounter: Payer: 59 | Admitting: Family Medicine

## 2015-05-18 ENCOUNTER — Encounter: Payer: Self-pay | Admitting: Family Medicine

## 2015-05-18 ENCOUNTER — Ambulatory Visit (INDEPENDENT_AMBULATORY_CARE_PROVIDER_SITE_OTHER): Payer: 59 | Admitting: Family Medicine

## 2015-05-18 VITALS — BP 122/84 | HR 64 | Temp 98.3°F | Ht 71.0 in | Wt 205.8 lb

## 2015-05-18 DIAGNOSIS — Z0000 Encounter for general adult medical examination without abnormal findings: Secondary | ICD-10-CM | POA: Diagnosis not present

## 2015-05-18 DIAGNOSIS — E781 Pure hyperglyceridemia: Secondary | ICD-10-CM | POA: Diagnosis not present

## 2015-05-18 NOTE — Assessment & Plan Note (Signed)
Info given on low cholesterol diet. Encouraged exercise, weight loss, healthy eating habits.

## 2015-05-18 NOTE — Patient Instructions (Signed)
Get back on track with healthy eating and regular  exercise! ? ?

## 2015-05-18 NOTE — Progress Notes (Signed)
Pre visit review using our clinic review tool, if applicable. No additional management support is needed unless otherwise documented below in the visit note. 

## 2015-05-18 NOTE — Progress Notes (Signed)
The patient is here for annual wellness exam and preventative care.    Wt Readings from Last 3 Encounters:  05/18/15 205 lb 12 oz (93.328 kg)  09/22/14 199 lb (90.266 kg)  05/12/14 202 lb 8 oz (91.853 kg)   Diet: Not great diet, eating out a lot.  Exercise; none, plans on starting back.   BP Readings from Last 3 Encounters:  05/18/15 122/84  09/22/14 120/80  05/12/14 104/74    Hypertriglyceridemia:  LDL at goal < 130 Lab Results  Component Value Date   CHOL 227* 05/07/2015   HDL 43.60 05/07/2015   LDLCALC 117* 03/29/2012   LDLDIRECT 124.0 05/07/2015   TRIG 231.0* 05/07/2015   CHOLHDL 5 05/07/2015    Prediabtes: resolved  Lab Results  Component Value Date   HGBA1C 5.3 05/07/2015     Review of Systems  Constitutional: Negative for fever, fatigue and unexpected weight change.  HENT: Negative for ear pain, congestion, sore throat, rhinorrhea, trouble swallowing and postnasal drip.  Eyes: Negative for pain.  Respiratory: Negative for cough, shortness of breath and wheezing.  Cardiovascular: Negative for chest pain, palpitations and leg swelling.  Gastrointestinal: Negative for nausea, abdominal pain, diarrhea, constipation and blood in stool.  Genitourinary: Negative for dysuria, urgency, hematuria, discharge, penile swelling, scrotal swelling, difficulty urinating, penile pain and testicular pain.  Skin: Rash Neurological: Negative for syncope, weakness, light-headedness, numbness and headaches.  Psychiatric/Behavioral: Negative for behavioral problems and dysphoric mood. The patient is not nervous/anxious.  Objective:   Physical Exam  Constitutional: He appears well-developed and well-nourished. Non-toxic appearance. He does not appear ill. No distress.  HENT:  Head: Normocephalic and atraumatic.  Right Ear: Hearing, tympanic membrane, external ear and ear canal normal.  Left Ear: Hearing, tympanic membrane, external ear and ear canal normal.  Nose: Nose  normal.  Mouth/Throat: Uvula is midline, oropharynx is clear and moist and mucous membranes are normal.  Eyes: Conjunctivae, EOM and lids are normal. Pupils are equal, round, and reactive to light. No foreign bodies found.  Neck: Trachea normal, normal range of motion and phonation normal. Neck supple. Carotid bruit is not present. No mass and no thyromegaly present.  Cardiovascular: Normal rate, regular rhythm, S1 normal, S2 normal, intact distal pulses and normal pulses. Exam reveals no gallop.  No murmur heard.  Pulmonary/Chest: Breath sounds normal. He has no wheezes. He has no rhonchi. He has no rales.  Abdominal: Soft. Normal appearance and bowel sounds are normal. There is no hepatosplenomegaly. There is no tenderness. There is no rebound, no guarding and no CVA tenderness. No hernia. Hernia confirmed negative in the right inguinal area and confirmed negative in the left inguinal area.  Genitourinary: Testes normal and penis normal. Right testis shows no mass and no tenderness. Left testis shows no mass and no tenderness. No paraphimosis or penile tenderness.  Lymphadenopathy:  He has no cervical adenopathy.  Right: No inguinal adenopathy present.  Left: No inguinal adenopathy present.  Neurological: He is alert. He has normal strength and normal reflexes. No cranial nerve deficit or sensory deficit. Gait normal.  Skin: Skin is warm, dry and intact. No rash noted.  Psychiatric: He has a normal mood and affect. His speech is normal and behavior is normal. Judgment normal.  Assessment & Plan:   The patient's preventative maintenance and recommended screening tests for an annual wellness exam were reviewed in full today.  Brought up to date unless services declined.  Counselled on the importance of diet, exercise, and its role  in overall health and mortality.  The patient's FH and SH was reviewed, including their home life, tobacco status, and drug and alcohol  status.  Nonsmoker.  Tetanus uptodate. No prostate cancer  Family history  Nml colonoscopy 2015, Dr. Carlean Purl.

## 2016-06-01 ENCOUNTER — Telehealth: Payer: Self-pay | Admitting: Family Medicine

## 2016-06-01 DIAGNOSIS — E781 Pure hyperglyceridemia: Secondary | ICD-10-CM

## 2016-06-01 NOTE — Telephone Encounter (Signed)
-----   Message from Ellamae Sia sent at 05/26/2016  4:11 PM EDT ----- Regarding: Lab orders for 8.25.17 Patient is scheduled for CPX labs, please order future labs, Thanks , Karna Christmas

## 2016-06-02 ENCOUNTER — Other Ambulatory Visit (INDEPENDENT_AMBULATORY_CARE_PROVIDER_SITE_OTHER): Payer: 59

## 2016-06-02 DIAGNOSIS — E781 Pure hyperglyceridemia: Secondary | ICD-10-CM

## 2016-06-02 DIAGNOSIS — R7989 Other specified abnormal findings of blood chemistry: Secondary | ICD-10-CM

## 2016-06-02 LAB — COMPREHENSIVE METABOLIC PANEL
ALT: 27 U/L (ref 0–53)
AST: 18 U/L (ref 0–37)
Albumin: 4.2 g/dL (ref 3.5–5.2)
Alkaline Phosphatase: 67 U/L (ref 39–117)
BUN: 13 mg/dL (ref 6–23)
CHLORIDE: 106 meq/L (ref 96–112)
CO2: 28 meq/L (ref 19–32)
CREATININE: 1.08 mg/dL (ref 0.40–1.50)
Calcium: 8.8 mg/dL (ref 8.4–10.5)
GFR: 79.03 mL/min (ref >60.00)
GLUCOSE: 96 mg/dL (ref 70–99)
POTASSIUM: 4.2 meq/L (ref 3.5–5.1)
SODIUM: 140 meq/L (ref 135–145)
Total Bilirubin: 0.5 mg/dL (ref 0.2–1.2)
Total Protein: 6.9 g/dL (ref 6.0–8.3)

## 2016-06-02 LAB — LIPID PANEL
CHOL/HDL RATIO: 5
CHOLESTEROL: 205 mg/dL — AB (ref 0–200)
HDL: 41.1 mg/dL (ref >39.00)
NONHDL: 164.33
Triglycerides: 245 mg/dL — ABNORMAL HIGH (ref 0.0–149.0)
VLDL: 49 mg/dL — AB (ref 0.0–40.0)

## 2016-06-02 LAB — LDL CHOLESTEROL, DIRECT: Direct LDL: 107 mg/dL

## 2016-06-09 ENCOUNTER — Encounter: Payer: Self-pay | Admitting: Family Medicine

## 2016-06-09 ENCOUNTER — Ambulatory Visit (INDEPENDENT_AMBULATORY_CARE_PROVIDER_SITE_OTHER): Payer: 59 | Admitting: Family Medicine

## 2016-06-09 VITALS — BP 118/86 | HR 66 | Temp 98.8°F | Ht 71.0 in | Wt 208.1 lb

## 2016-06-09 DIAGNOSIS — E781 Pure hyperglyceridemia: Secondary | ICD-10-CM

## 2016-06-09 DIAGNOSIS — Z23 Encounter for immunization: Secondary | ICD-10-CM

## 2016-06-09 DIAGNOSIS — Z0000 Encounter for general adult medical examination without abnormal findings: Secondary | ICD-10-CM | POA: Diagnosis not present

## 2016-06-09 NOTE — Patient Instructions (Signed)
Keep working on healthy lifestyle, low fat, low chol diet, keep up with exercise. Increase as able to 150 min per week.

## 2016-06-09 NOTE — Assessment & Plan Note (Signed)
Encouraged exercise, weight loss, healthy eating habits. ? ?

## 2016-06-09 NOTE — Progress Notes (Signed)
The patient is here for annual wellness exam and preventative care.    Wt Readings from Last 3 Encounters:  06/09/16 208 lb 1.9 oz (94.4 kg)  05/18/15 205 lb 12 oz (93.3 kg)  09/22/14 199 lb (90.3 kg)  Diet: Eating out a lot. Exercise: started back 1-2 on bike and starting yoga.   BP Readings from Last 3 Encounters:  06/09/16 118/86  05/18/15 122/84  09/22/14 120/80   Hypertriglyceridemia:  LDL at goal < 130 Lab Results  Component Value Date   CHOL 205 (H) 06/02/2016   HDL 41.10 06/02/2016   LDLCALC 117 (H) 03/29/2012   LDLDIRECT 107.0 06/02/2016   TRIG 245.0 (H) 06/02/2016   CHOLHDL 5 06/02/2016   Social History /Family History/Past Medical History reviewed and updated if needed.   Review of Systems  Constitutional: Negative for fever, fatigue and unexpected weight change.  HENT: Negative for ear pain, congestion, sore throat, rhinorrhea, trouble swallowing and postnasal drip.  Eyes: Negative for pain.  Respiratory: Negative for cough, shortness of breath and wheezing.  Cardiovascular: Negative for chest pain, palpitations and leg swelling.  Gastrointestinal: Negative for nausea, abdominal pain, diarrhea, constipation and blood in stool.  Genitourinary: Negative for dysuria, urgency, hematuria, discharge, penile swelling, scrotal swelling, difficulty urinating, penile pain and testicular pain.  Skin: Rash Neurological: Negative for syncope, weakness, light-headedness, numbness and headaches.  Psychiatric/Behavioral: Negative for behavioral problems and dysphoric mood. The patient is not nervous/anxious.  Objective:   Physical Exam  Constitutional: He appears well-developed and well-nourished. Non-toxic appearance. He does not appear ill. No distress.  HENT:  Head: Normocephalic and atraumatic.  Right Ear: Hearing, tympanic membrane, external ear and ear canal normal.  Left Ear: Hearing, tympanic membrane, external ear and ear canal normal.  Nose: Nose  normal.  Mouth/Throat: Uvula is midline, oropharynx is clear and moist and mucous membranes are normal.  Eyes: Conjunctivae, EOM and lids are normal. Pupils are equal, round, and reactive to light. No foreign bodies found.  Neck: Trachea normal, normal range of motion and phonation normal. Neck supple. Carotid bruit is not present. No mass and no thyromegaly present.  Cardiovascular: Normal rate, regular rhythm, S1 normal, S2 normal, intact distal pulses and normal pulses. Exam reveals no gallop.  No murmur heard.  Pulmonary/Chest: Breath sounds normal. He has no wheezes. He has no rhonchi. He has no rales.  Abdominal: Soft. Normal appearance and bowel sounds are normal. There is no hepatosplenomegaly. There is no tenderness. There is no rebound, no guarding and no CVA tenderness. No hernia. Hernia confirmed negative in the right inguinal area and confirmed negative in the left inguinal area.  Genitourinary:  No issue, not indicated. Lymphadenopathy:  He has no cervical adenopathy.  Right: No inguinal adenopathy present.  Left: No inguinal adenopathy present.  Neurological: He is alert. He has normal strength and normal reflexes. No cranial nerve deficit or sensory deficit. Gait normal.  Skin: Skin is warm, dry and intact. No rash noted.  Psychiatric: He has a normal mood and affect. His speech is normal and behavior is normal. Judgment normal.  Assessment & Plan:   The patient's preventative maintenance and recommended screening tests for an annual wellness exam were reviewed in full today.  Brought up to date unless services declined.  Counselled on the importance of diet, exercise, and its role in overall health and mortality.  The patient's FH and SH was reviewed, including their home life, tobacco status, and drug and alcohol status.  Nonsmoker.  Tetanus  uptodate. Get flu vaccine today. No prostate cancer  family history Nml colonoscopy 2015, Dr. Carlean Purl. Recall in 2023  at age 29  STD screen:  Refused.

## 2016-06-09 NOTE — Addendum Note (Signed)
Addended by: Jacqualin Combes on: 06/09/2016 02:56 PM   Modules accepted: Orders

## 2016-06-09 NOTE — Progress Notes (Signed)
Pre visit review using our clinic review tool, if applicable. No additional management support is needed unless otherwise documented below in the visit note. 

## 2016-06-20 ENCOUNTER — Other Ambulatory Visit: Payer: Self-pay | Admitting: Family Medicine

## 2016-07-14 ENCOUNTER — Encounter: Payer: Self-pay | Admitting: Family Medicine

## 2016-07-14 ENCOUNTER — Ambulatory Visit (INDEPENDENT_AMBULATORY_CARE_PROVIDER_SITE_OTHER): Payer: 59 | Admitting: Family Medicine

## 2016-07-14 ENCOUNTER — Ambulatory Visit
Admission: RE | Admit: 2016-07-14 | Discharge: 2016-07-14 | Disposition: A | Discharge status: Home / Self Care | Payer: 59 | Source: Ambulatory Visit | Attending: Family Medicine | Admitting: Family Medicine

## 2016-07-14 ENCOUNTER — Ambulatory Visit (INDEPENDENT_AMBULATORY_CARE_PROVIDER_SITE_OTHER)
Admission: RE | Admit: 2016-07-14 | Discharge: 2016-07-14 | Disposition: A | Discharge status: Home / Self Care | Payer: 59 | Source: Ambulatory Visit | Attending: Family Medicine | Admitting: Family Medicine

## 2016-07-14 VITALS — BP 120/82 | HR 77 | Temp 98.5°F | Ht 71.0 in | Wt 210.2 lb

## 2016-07-14 DIAGNOSIS — M25562 Pain in left knee: Secondary | ICD-10-CM

## 2016-07-14 DIAGNOSIS — G8929 Other chronic pain: Secondary | ICD-10-CM

## 2016-07-14 DIAGNOSIS — M25532 Pain in left wrist: Secondary | ICD-10-CM

## 2016-07-14 DIAGNOSIS — M25561 Pain in right knee: Secondary | ICD-10-CM

## 2016-07-14 MED ORDER — MELOXICAM 15 MG PO TABS: 15.0000 mg | ORAL_TABLET | Freq: Every day | ORAL | 0 refills | Status: DC
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
## 2016-07-14 NOTE — Assessment & Plan Note (Signed)
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  Will start NSAID prn, eval with x-ray.  Use glucosamine  Daily.

## 2016-07-14 NOTE — Progress Notes (Signed)
Pre visit review using our clinic review tool, if applicable. No additional management support is needed unless otherwise documented below in the visit note. 

## 2016-07-14 NOTE — Patient Instructions (Signed)
Start meloxicam  As needed for inflammation and pain. Start glucosamine back 500 mg 1-3 times daily. We will call you with X-ray results.

## 2016-07-14 NOTE — Assessment & Plan Note (Signed)
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  Will start NSAID prn, eval with x-ray.  Use glucosamine daily.

## 2016-08-11 ENCOUNTER — Other Ambulatory Visit: Payer: Self-pay | Admitting: Family Medicine

## 2016-08-11 NOTE — Telephone Encounter (Signed)
Last office visit 07/14/2016.  Last refilled 07/14/2016 for #30 with no refills.  Ok to refill?

## 2016-09-10 ENCOUNTER — Other Ambulatory Visit: Payer: Self-pay | Admitting: Family Medicine

## 2016-09-10 NOTE — Telephone Encounter (Signed)
Last office visit 07/14/16.  Last refilled 08/11/16 for #30 with no refills.  Ok to refill?

## 2017-01-03 ENCOUNTER — Encounter: Payer: Self-pay | Admitting: Family Medicine

## 2017-01-03 ENCOUNTER — Ambulatory Visit (INDEPENDENT_AMBULATORY_CARE_PROVIDER_SITE_OTHER): Payer: 59 | Admitting: Family Medicine

## 2017-01-03 VITALS — BP 114/78 | HR 88 | Temp 99.0°F | Wt 185.0 lb

## 2017-01-03 DIAGNOSIS — A09 Infectious gastroenteritis and colitis, unspecified: Secondary | ICD-10-CM | POA: Diagnosis not present

## 2017-01-03 DIAGNOSIS — R197 Diarrhea, unspecified: Secondary | ICD-10-CM

## 2017-01-03 LAB — CBC WITH DIFFERENTIAL/PLATELET
BASOS PCT: 0.6 % (ref 0.0–3.0)
Basophils Absolute: 0 10*3/uL (ref 0.0–0.1)
EOS PCT: 0.5 % (ref 0.0–5.0)
Eosinophils Absolute: 0 10*3/uL (ref 0.0–0.7)
HCT: 46.2 % (ref 39.0–52.0)
Hemoglobin: 15.6 g/dL (ref 13.0–17.0)
LYMPHS ABS: 0.8 10*3/uL (ref 0.7–4.0)
Lymphocytes Relative: 14.9 % (ref 12.0–46.0)
MCHC: 33.8 g/dL (ref 30.0–36.0)
MCV: 90.5 fl (ref 78.0–100.0)
MONO ABS: 0.5 10*3/uL (ref 0.1–1.0)
Monocytes Relative: 9.1 % (ref 3.0–12.0)
NEUTROS PCT: 74.9 % (ref 43.0–77.0)
Neutro Abs: 4 10*3/uL (ref 1.4–7.7)
PLATELETS: 180 10*3/uL (ref 150.0–400.0)
RBC: 5.11 Mil/uL (ref 4.22–5.81)
RDW: 12.8 % (ref 11.5–15.5)
WBC: 5.3 10*3/uL (ref 4.0–10.5)

## 2017-01-03 NOTE — Progress Notes (Signed)
Subjective:    Patient ID: Raymond Lloyd, male    DOB: 1971-09-18, 46 y.o.   MRN: [id number]  HPI This is a 46 yo male who presents today with diarrhea and vomiting x 2 days. Two days ago had diarrhea in the evening and through the night, sweats and chills. Diarrhea almost every hour. Stool is watery and black. A little mucus, a little blood with wiping only. Vomited 4x yesterday. None today. Had some applesauce, 7 up, water and pedialyte today. Stomach grumbling. No recent travel. No unusual dining out, no raw food. No sick contacts. A little lightheadedness yesterday, none today. No abdominal pain. No chest pain or SOB. No dysuria, no hematuria, no urinary frequency. Feels a little bit better today.  Has history of IBS, this has been better with eating better and working out. Recent Weight loss is intentional.   Past Medical History:  Diagnosis Date  . Colon polyp   . History of eczema   . IBS (irritable bowel syndrome)    Past Surgical History:  Procedure Laterality Date  . COLONOSCOPY W/ BIOPSIES    . HEMORRHOID BANDING  2014   multiple  . MINOR HEMORRHOIDECTOMY     Thrombosed hemorrhoid treatment  . PILONIDAL CYST EXCISION  2007  . VASECTOMY     Family History  Problem Relation Age of Onset  . Hypertension Father   . Hyperlipidemia Father   . Breast cancer Mother   . Cancer Paternal Grandfather     ?  Marland Kitchen Heart attack Paternal Grandfather 49   Social History  Substance Use Topics  . Smoking status: Never Smoker  . Smokeless tobacco: Former Systems developer  . Alcohol use Yes     Comment: Occasional      Review of Systems Per HPI    Objective:   Physical Exam  Constitutional: He is oriented to person, place, and time. He appears well-developed and well-nourished. No distress.  HENT:  Head: Normocephalic and atraumatic.  Mouth/Throat: Oropharynx is clear and moist.  Eyes: Conjunctivae are normal.  Neck: Normal range of motion. Neck supple.  Cardiovascular: Normal rate,  regular rhythm and normal heart sounds.   Pulmonary/Chest: Effort normal and breath sounds normal.  Abdominal: Soft. Bowel sounds are increased. There is no tenderness. There is no rigidity, no rebound, no guarding, no tenderness at McBurney's point and negative Murphy's sign.  Musculoskeletal: Normal range of motion.  Neurological: He is alert and oriented to person, place, and time.  Skin: Skin is warm and dry. He is not diaphoretic.  Psychiatric: He has a normal mood and affect. His behavior is normal. Judgment and thought content normal.  Vitals reviewed.     BP 114/78 (BP Location: Right Arm, Patient Position: Sitting, Cuff Size: Normal)   Pulse 88   Temp 99 F (37.2 C) (Oral)   Wt 185 lb (83.9 kg)   SpO2 97%   BMI 25.80 kg/m  Wt Readings from Last 3 Encounters:  01/03/17 185 lb (83.9 kg)  07/14/16 210 lb 4 oz (95.4 kg)  06/09/16 208 lb 1.9 oz (94.4 kg)       Assessment & Plan:  1. Diarrhea of presumed infectious origin - non toxic appearance, vitals stable, feeling better today - Provided written and verbal information regarding diagnosis and treatment. - oral rehydration instructions discussed - RTC/ER precautions reviewed - CBC with Differential/Platelet   Clarene Reamer, FNP-BC  [facility] [place]  01/04/2017 8:31 PM

## 2017-01-03 NOTE — Patient Instructions (Signed)
I will notify you of your blood work via [place] Please let us know if you are not better in a couple of days and we can check stool studies  Food Choices to Help Relieve Diarrhea, Adult When you have diarrhea, the foods you eat and your eating habits are very important. Choosing the right foods and drinks can help:  Relieve diarrhea.  Replace lost fluids and nutrients.  Prevent dehydration. What general guidelines should I follow? Relieving diarrhea   Choose foods with less than 2 g or .07 oz. of fiber per serving.  Limit fats to less than 8 tsp (38 g or 1.34 oz.) a day.  Avoid the following:  Foods and beverages sweetened with high-fructose corn syrup, honey, or sugar alcohols such as xylitol, sorbitol, and mannitol.  Foods that contain a lot of fat or sugar.  Fried, greasy, or spicy foods.  High-fiber grains, breads, and cereals.  Raw fruits and vegetables.  Eat foods that are rich in probiotics. These foods include dairy products such as yogurt and fermented milk products. They help increase healthy bacteria in the stomach and intestines (gastrointestinal tract, or GI tract).  If you have lactose intolerance, avoid dairy products. These may make your diarrhea worse.  Take medicine to help stop diarrhea (antidiarrheal medicine) only as told by your health care provider. Replacing nutrients   Eat small meals or snacks every 3-4 hours.  Eat bland foods, such as white rice, toast, or baked potato, until your diarrhea starts to get better. Gradually reintroduce nutrient-rich foods as tolerated or as told by your health care provider. This includes:  Well-cooked protein foods.  Peeled, seeded, and soft-cooked fruits and vegetables.  Low-fat dairy products.  Take vitamin and mineral supplements as told by your health care provider. Preventing dehydration    Start by sipping water or a special solution to prevent dehydration (oral rehydration solution, ORS). Urine that  is clear or pale yellow means that you are getting enough fluid.  Try to drink at least 8-10 cups of fluid each day to help replace lost fluids.  You may add other liquids in addition to water, such as clear juice or decaffeinated sports drinks, as tolerated or as told by your health care provider.  Avoid drinks with caffeine, such as coffee, tea, or soft drinks.  Avoid alcohol. What foods are recommended? The items listed may not be a complete list. Talk with your health care provider about what dietary choices are best for you. Grains  White rice. White, [place], or pita breads (fresh or toasted), including plain rolls, buns, or bagels. White pasta. Saltine, soda, or graham crackers. Pretzels. Low-fiber cereal. Cooked cereals made with water (such as cornmeal, farina, or cream cereals). Plain muffins. Matzo. Melba toast. Zwieback. Vegetables  Potatoes (without the skin). Most well-cooked and canned vegetables without skins or seeds. Tender lettuce. Fruits  Apple sauce. Fruits canned in juice. Cooked apricots, cherries, grapefruit, peaches, pears, or plums. Fresh bananas and cantaloupe. Meats and other protein foods  Baked or boiled chicken. Eggs. Tofu. Fish. Seafood. Smooth nut butters. Ground or well-cooked tender beef, ham, veal, lamb, pork, or poultry. Dairy  Plain yogurt, kefir, and unsweetened liquid yogurt. Lactose-free milk, buttermilk, skim milk, or soy milk. Low-fat or nonfat hard cheese. Beverages  Water. Low-calorie sports drinks. Fruit juices without pulp. Strained tomato and vegetable juices. Decaffeinated teas. Sugar-free beverages not sweetened with sugar alcohols. Oral rehydration solutions, if approved by your health care provider. Seasoning and other foods  Bouillon,  broth, or soups made from recommended foods. What foods are not recommended? The items listed may not be a complete list. Talk with your health care provider about what dietary choices are best for  you. Grains  Whole grain, whole wheat, bran, or rye breads, rolls, pastas, and crackers. Wild or brown rice. Whole grain or bran cereals. Barley. Oats and oatmeal. Corn tortillas or taco shells. Granola. Popcorn. Vegetables  Raw vegetables. Fried vegetables. Cabbage, broccoli, Brussels sprouts, artichokes, baked beans, beet greens, corn, kale, legumes, peas, sweet potatoes, and yams. Potato skins. Cooked spinach and cabbage. Fruits  Dried fruit, including raisins and dates. Raw fruits. Stewed or dried prunes. Canned fruits with syrup. Meat and other protein foods  Fried or fatty meats. Deli meats. Chunky nut butters. Nuts and seeds. Beans and lentils. Berniece Salines. Hot dogs. Sausage. Dairy  High-fat cheeses. Whole milk, chocolate milk, and beverages made with milk, such as milk shakes. Half-and-half. Cream. sour cream. Ice cream. Beverages  Caffeinated beverages (such as coffee, tea, soda, or energy drinks). Alcoholic beverages. Fruit juices with pulp. Prune juice. Soft drinks sweetened with high-fructose corn syrup or sugar alcohols. High-calorie sports drinks. Fats and oils  Butter. Cream sauces. Margarine. Salad oils. Plain salad dressings. Olives. Avocados. Mayonnaise. Sweets and desserts  Sweet rolls, doughnuts, and sweet breads. Sugar-free desserts sweetened with sugar alcohols such as xylitol and sorbitol. Seasoning and other foods  Honey. Hot sauce. Chili powder. Gravy. Cream-based or milk-based soups. Pancakes and waffles. Summary  When you have diarrhea, the foods you eat and your eating habits are very important.  Make sure you get at least 8-10 cups of fluid each day, or enough to keep your urine clear or pale yellow.  Eat bland foods and gradually reintroduce healthy, nutrient-rich foods as tolerated, or as told by your health care provider.  Avoid high-fiber, fried, greasy, or spicy foods. This information is not intended to replace advice given to you by your health care provider.  Make sure you discuss any questions you have with your health care provider. Document Released: 12/16/2003 Document Revised: 09/22/2016 Document Reviewed: 09/22/2016 Elsevier Interactive Patient Education  2017 [employer].

## 2017-01-03 NOTE — Progress Notes (Signed)
Pre visit review using our clinic review tool, if applicable. No additional management support is needed unless otherwise documented below in the visit note. 

## 2017-03-14 DIAGNOSIS — M25572 Pain in left ankle and joints of left foot: Secondary | ICD-10-CM | POA: Diagnosis not present

## 2017-03-14 DIAGNOSIS — M25571 Pain in right ankle and joints of right foot: Secondary | ICD-10-CM | POA: Diagnosis not present

## 2017-03-20 DIAGNOSIS — J302 Other seasonal allergic rhinitis: Secondary | ICD-10-CM | POA: Diagnosis not present

## 2017-03-20 DIAGNOSIS — Z0000 Encounter for general adult medical examination without abnormal findings: Secondary | ICD-10-CM | POA: Diagnosis not present

## 2017-05-14 ENCOUNTER — Other Ambulatory Visit: Payer: Self-pay | Admitting: Family Medicine

## 2017-05-14 NOTE — Telephone Encounter (Signed)
Last office visit 01/03/2017 with D. Carlean Purl.  Last refilled 09/12/2016 for #30 with no refills.  Ok to refill?

## 2017-06-15 ENCOUNTER — Other Ambulatory Visit: Payer: Self-pay | Admitting: Family Medicine

## 2017-06-15 NOTE — Telephone Encounter (Signed)
Last office visit 01/03/2017 with D. Carlean Purl.  Last refilled 05/14/2017 for #30 with no refills.  Ok to refill?

## 2017-07-02 ENCOUNTER — Telehealth: Payer: Self-pay | Admitting: Family Medicine

## 2017-07-02 ENCOUNTER — Other Ambulatory Visit: Payer: Self-pay | Admitting: Family Medicine

## 2017-07-02 DIAGNOSIS — E781 Pure hyperglyceridemia: Secondary | ICD-10-CM

## 2017-07-02 NOTE — Addendum Note (Signed)
Addended by: Ellamae Sia on: 07/02/2017 10:37 AM   Modules accepted: Orders

## 2017-07-02 NOTE — Telephone Encounter (Signed)
-----   Message from Ellamae Sia sent at 06/25/2017 10:34 AM EDT ----- Regarding: Lab orders for Monday, 9.24.18 Patient is scheduled for CPX labs, please order future labs, Thanks , Karna Christmas

## 2017-07-03 ENCOUNTER — Other Ambulatory Visit (INDEPENDENT_AMBULATORY_CARE_PROVIDER_SITE_OTHER): Payer: 59

## 2017-07-03 DIAGNOSIS — E781 Pure hyperglyceridemia: Secondary | ICD-10-CM | POA: Diagnosis not present

## 2017-07-03 LAB — LIPID PANEL
CHOL/HDL RATIO: 5
Cholesterol: 216 mg/dL — ABNORMAL HIGH (ref 0–200)
HDL: 47.5 mg/dL (ref >39.00)
LDL CALC: 133 mg/dL — AB (ref 0–99)
NonHDL: 168.78
TRIGLYCERIDES: 181 mg/dL — AB (ref 0.0–149.0)
VLDL: 36.2 mg/dL (ref 0.0–40.0)

## 2017-07-03 LAB — COMPREHENSIVE METABOLIC PANEL
ALBUMIN: 4.3 g/dL (ref 3.5–5.2)
ALK PHOS: 64 U/L (ref 39–117)
ALT: 18 U/L (ref 0–53)
AST: 18 U/L (ref 0–37)
BILIRUBIN TOTAL: 0.6 mg/dL (ref 0.2–1.2)
BUN: 15 mg/dL (ref 6–23)
CALCIUM: 9.2 mg/dL (ref 8.4–10.5)
CO2: 31 mEq/L (ref 19–32)
CREATININE: 0.9 mg/dL (ref 0.40–1.50)
Chloride: 103 mEq/L (ref 96–112)
GFR: 97.05 mL/min (ref >60.00)
Glucose, Bld: 101 mg/dL — ABNORMAL HIGH (ref 70–99)
Potassium: 4.7 mEq/L (ref 3.5–5.1)
SODIUM: 139 meq/L (ref 135–145)
TOTAL PROTEIN: 7 g/dL (ref 6.0–8.3)

## 2017-07-09 LAB — CELIAC PNL 2 RFLX ENDOMYSIAL AB TTR
(TTG) AB, IGG: 2 U/mL
(tTG) Ab, IgA: <1 U/mL
Endomysial Ab IgA: NEGATIVE
GLIADIN(DEAM) AB,IGA: 4 U (ref <20)
GLIADIN(DEAM) AB,IGG: 13 U (ref <20)
Immunoglobulin A: 267 mg/dL (ref 81–463)

## 2017-07-10 ENCOUNTER — Encounter: Payer: Self-pay | Admitting: Family Medicine

## 2017-07-10 ENCOUNTER — Ambulatory Visit (INDEPENDENT_AMBULATORY_CARE_PROVIDER_SITE_OTHER): Payer: 59 | Admitting: Family Medicine

## 2017-07-10 VITALS — BP 120/82 | HR 74 | Temp 98.4°F | Ht 70.75 in | Wt 186.2 lb

## 2017-07-10 DIAGNOSIS — Z0000 Encounter for general adult medical examination without abnormal findings: Secondary | ICD-10-CM

## 2017-07-10 DIAGNOSIS — E781 Pure hyperglyceridemia: Secondary | ICD-10-CM | POA: Diagnosis not present

## 2017-07-10 DIAGNOSIS — Z23 Encounter for immunization: Secondary | ICD-10-CM

## 2017-07-10 NOTE — Assessment & Plan Note (Signed)
Encouraged exercise, weight loss, healthy eating habits. ? ?

## 2017-07-10 NOTE — Patient Instructions (Signed)
Keep working on healthy eating and regular exercise! 

## 2017-07-10 NOTE — Progress Notes (Signed)
Subjective:    Patient ID: Raymond Lloyd, male    DOB: August 20, 1971, 46 y.o.   MRN: [id number]  HPI The patient is here for annual wellness exam and preventative care.    Elevated Cholesterol: Hypertriglyceridemia, improved. LDL worsened control. Lab Results  Component Value Date   CHOL 216 (H) 07/03/2017   HDL 47.50 07/03/2017   LDLCALC 133 (H) 07/03/2017   LDLDIRECT 107.0 06/02/2016   TRIG 181.0 (H) 07/03/2017   CHOLHDL 5 07/03/2017  Using medications without problems: Muscle aches:  Diet compliance: healthy diet, avoiding fried food.  In last 6 weeks has been traveling. Exercise: regularly Other complaints: Body mass index is 26.16 kg/m.    Wt Readings from Last 3 Encounters:  07/10/17 186 lb 4 oz (84.5 kg)  01/03/17 185 lb (83.9 kg)  07/14/16 210 lb 4 oz (95.4 kg)     Stable joint pain.. 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 Using meloxicam as needed for pain.  Social History /Family History/Past Medical History reviewed in detail and updated in EMR if needed. Blood pressure 120/82, pulse 74, temperature 98.4 F (36.9 C), temperature source Oral, height 5' 10.75" (1.797 m), weight 186 lb 4 oz (84.5 kg).  Review of Systems  Constitutional: Negative for fatigue and fever.  HENT: Negative for ear pain.   Eyes: Negative for pain.  Respiratory: Negative for cough and shortness of breath.   Cardiovascular: Negative for chest pain, palpitations and leg swelling.  Gastrointestinal: Negative for abdominal pain.  Genitourinary: Negative for dysuria.  Musculoskeletal: Negative for arthralgias.  Neurological: Negative for syncope, light-headedness and headaches.  Psychiatric/Behavioral: Negative for dysphoric mood.       Objective:   Physical Exam  Constitutional: He appears well-developed and well-nourished.  Non-toxic appearance. He does not appear ill. No distress.  HENT:  Head: Normocephalic and atraumatic.  Right Ear: Hearing, tympanic membrane, external ear and ear canal  normal.  Left Ear: Hearing, tympanic membrane, external ear and ear canal normal.  Nose: Nose normal.  Mouth/Throat: Uvula is midline, oropharynx is clear and moist and mucous membranes are normal.  Eyes: Pupils are equal, round, and reactive to light. Conjunctivae, EOM and lids are normal. Lids are everted and swept, no foreign bodies found.  Neck: Trachea normal, normal range of motion and phonation normal. Neck supple. Carotid bruit is not present. No thyroid mass and no thyromegaly present.  Cardiovascular: Normal rate, regular rhythm, S1 normal, S2 normal, intact distal pulses and normal pulses.  Exam reveals no gallop.   No murmur heard. Pulmonary/Chest: Breath sounds normal. He has no wheezes. He has no rhonchi. He has no rales.  Abdominal: Soft. Normal appearance and bowel sounds are normal. There is no hepatosplenomegaly. There is no tenderness. There is no rebound, no guarding and no CVA tenderness. No hernia.  Lymphadenopathy:    He has no cervical adenopathy.  Neurological: He is alert. He has normal strength and normal reflexes. No cranial nerve deficit or sensory deficit. Gait normal.  Skin: Skin is warm, dry and intact. No rash noted.  Psychiatric: He has a normal mood and affect. His speech is normal and behavior is normal. Judgment normal.          Assessment & Plan:  The patient's preventative maintenance and recommended screening tests for an annual wellness exam were reviewed in full today. Brought up to date unless services declined.  Counselled on the importance of diet, exercise, and its role in overall health and mortality. The patient's FH and [facility]  was reviewed, including their home life, tobacco status, and drug and alcohol status.   Nonsmoker.  Tetanus uptodate. Get flu vaccine today. No prostate cancer family history Nml colonoscopy 2015, Dr. Carlean Purl. Recall in 2023 at age 9  STD screen:  Refused.

## 2017-07-12 ENCOUNTER — Other Ambulatory Visit: Payer: Self-pay | Admitting: Family Medicine

## 2017-07-12 NOTE — Telephone Encounter (Signed)
Last office visit 07/10/2017.  Last refilled 06/15/2017 for #30 with no refills.  Ok to refill? 

## 2017-08-11 ENCOUNTER — Other Ambulatory Visit: Payer: Self-pay | Admitting: Family Medicine

## 2017-08-12 NOTE — Telephone Encounter (Signed)
Last office visit 07/10/17.  Last refilled 07/12/17 for #30 with no refills.  Ok to refill?

## 2017-09-20 ENCOUNTER — Other Ambulatory Visit: Payer: Self-pay | Admitting: Family Medicine

## 2017-09-20 NOTE — Telephone Encounter (Signed)
Last office visit 07/10/2017.  Last refilled 08/13/2017 for #30 with no refills.  Ok to refill?

## 2017-10-19 ENCOUNTER — Other Ambulatory Visit: Payer: Self-pay | Admitting: Family Medicine

## 2017-10-19 NOTE — Telephone Encounter (Signed)
Last office visit 07/10/2017.  Last refilled 09/20/2017 for #30 with no refills.  Ok to refill?

## 2017-11-16 ENCOUNTER — Other Ambulatory Visit: Payer: Self-pay | Admitting: Family Medicine

## 2017-11-16 NOTE — Telephone Encounter (Signed)
Last office visit 07/10/2017.  Last refilled 10/19/2017 for #30 with no refills.  Ok to refill?

## 2017-12-16 ENCOUNTER — Other Ambulatory Visit: Payer: Self-pay | Admitting: Family Medicine

## 2017-12-17 NOTE — Telephone Encounter (Signed)
Last office visit 07/10/2017.  Last refilled 11/16/2017 for #30 with no refills.  Ok to refill?

## 2017-12-20 ENCOUNTER — Other Ambulatory Visit: Payer: Self-pay | Admitting: Family Medicine

## 2018-01-18 ENCOUNTER — Other Ambulatory Visit: Payer: Self-pay | Admitting: Family Medicine

## 2018-01-18 NOTE — Telephone Encounter (Signed)
Last office visit 07/10/2017.  Last refilled 12/18/2017 for #30 with no refills.  Ok to refill?

## 2018-03-14 ENCOUNTER — Other Ambulatory Visit: Payer: Self-pay | Admitting: Family Medicine

## 2018-03-14 NOTE — Telephone Encounter (Signed)
Last office visit 07/10/2017.  Last refilled 01/18/2018 for #30 with 1 refill.  Ok to refill?

## 2018-03-15 ENCOUNTER — Other Ambulatory Visit: Payer: Self-pay | Admitting: Family Medicine

## 2018-03-18 DIAGNOSIS — M25561 Pain in right knee: Secondary | ICD-10-CM | POA: Diagnosis not present

## 2018-03-18 DIAGNOSIS — M25562 Pain in left knee: Secondary | ICD-10-CM | POA: Diagnosis not present

## 2018-03-18 DIAGNOSIS — J302 Other seasonal allergic rhinitis: Secondary | ICD-10-CM | POA: Diagnosis not present

## 2018-05-09 ENCOUNTER — Other Ambulatory Visit: Payer: Self-pay | Admitting: Family Medicine

## 2018-05-09 NOTE — Telephone Encounter (Signed)
Last office visit 07/10/2017 for CPE.  Last refilled 03/14/2018 for #30 with 1 refill.  Ok to refill?

## 2018-07-03 ENCOUNTER — Telehealth: Payer: Self-pay | Admitting: Family Medicine

## 2018-07-03 DIAGNOSIS — E781 Pure hyperglyceridemia: Secondary | ICD-10-CM

## 2018-07-03 NOTE — Telephone Encounter (Signed)
-----   Message from Lendon Collar, RT sent at 07/01/2018  9:40 AM EDT ----- Regarding: Lab orders for Tuesday 07/09/18 Please enter CPE lab orders for 07/09/18. Thanks!

## 2018-07-07 ENCOUNTER — Other Ambulatory Visit: Payer: Self-pay | Admitting: Family Medicine

## 2018-07-08 NOTE — Telephone Encounter (Signed)
Last office visit 07/16/2017 for CPE.  Last refilled 05/10/2018 for #30 with 1 refill.   CPE scheduled for 07/16/2018.  Ok to refill?

## 2018-07-09 ENCOUNTER — Other Ambulatory Visit (INDEPENDENT_AMBULATORY_CARE_PROVIDER_SITE_OTHER): Payer: 59

## 2018-07-09 DIAGNOSIS — E781 Pure hyperglyceridemia: Secondary | ICD-10-CM

## 2018-07-09 LAB — LIPID PANEL
CHOL/HDL RATIO: 5
CHOLESTEROL: 220 mg/dL — AB (ref 0–200)
HDL: 47.4 mg/dL (ref >39.00)
NonHDL: 172.61
TRIGLYCERIDES: 216 mg/dL — AB (ref 0.0–149.0)
VLDL: 43.2 mg/dL — ABNORMAL HIGH (ref 0.0–40.0)

## 2018-07-09 LAB — COMPREHENSIVE METABOLIC PANEL
ALBUMIN: 4.1 g/dL (ref 3.5–5.2)
ALT: 21 U/L (ref 0–53)
AST: 17 U/L (ref 0–37)
Alkaline Phosphatase: 53 U/L (ref 39–117)
BUN: 22 mg/dL (ref 6–23)
CALCIUM: 8.8 mg/dL (ref 8.4–10.5)
CO2: 28 mEq/L (ref 19–32)
CREATININE: 1.08 mg/dL (ref 0.40–1.50)
Chloride: 104 mEq/L (ref 96–112)
GFR: 78.28 mL/min (ref >60.00)
Glucose, Bld: 98 mg/dL (ref 70–99)
Potassium: 4.1 mEq/L (ref 3.5–5.1)
SODIUM: 140 meq/L (ref 135–145)
Total Bilirubin: 0.6 mg/dL (ref 0.2–1.2)
Total Protein: 6.7 g/dL (ref 6.0–8.3)

## 2018-07-09 LAB — LDL CHOLESTEROL, DIRECT: Direct LDL: 113 mg/dL

## 2018-07-16 ENCOUNTER — Ambulatory Visit (INDEPENDENT_AMBULATORY_CARE_PROVIDER_SITE_OTHER): Payer: 59 | Admitting: Family Medicine

## 2018-07-16 ENCOUNTER — Encounter: Payer: Self-pay | Admitting: Family Medicine

## 2018-07-16 VITALS — BP 110/70 | HR 74 | Temp 98.2°F | Ht 71.0 in | Wt 190.8 lb

## 2018-07-16 DIAGNOSIS — Z0000 Encounter for general adult medical examination without abnormal findings: Secondary | ICD-10-CM | POA: Diagnosis not present

## 2018-07-16 DIAGNOSIS — M25562 Pain in left knee: Secondary | ICD-10-CM

## 2018-07-16 DIAGNOSIS — E781 Pure hyperglyceridemia: Secondary | ICD-10-CM

## 2018-07-16 DIAGNOSIS — Z23 Encounter for immunization: Secondary | ICD-10-CM | POA: Diagnosis not present

## 2018-07-16 DIAGNOSIS — M25561 Pain in right knee: Secondary | ICD-10-CM

## 2018-07-16 DIAGNOSIS — G8929 Other chronic pain: Secondary | ICD-10-CM

## 2018-07-16 NOTE — Assessment & Plan Note (Signed)
Melocicam as needed, limit as able.

## 2018-07-16 NOTE — Patient Instructions (Signed)
Work on low fat, low cholesterol diet. Keep up healthy lifestyle.

## 2018-07-16 NOTE — Progress Notes (Signed)
Subjective:    Patient ID: Raymond Lloyd, male    DOB: October 05, 1971, 47 y.o.   MRN: [id number]  HPI  The patient is here for annual wellness exam and preventative care.    Elevated Cholesterol:  Trig not at goal.. Forgetting to take fish. Lab Results  Component Value Date   CHOL 220 (H) 07/09/2018   HDL 47.40 07/09/2018   LDLCALC 133 (H) 07/03/2017   LDLDIRECT 113.0 07/09/2018   TRIG 216.0 (H) 07/09/2018   CHOLHDL 5 07/09/2018  Using medications without problems: Muscle aches:  Diet compliance: moderate Exercise: very active  3-4 times a week Other complaints:   Using meloxicam for OA of knees.  [facility] as well.   Body mass index is 26.6 kg/m. Wt Readings from Last 3 Encounters:  07/16/18 190 lb 12 oz (86.5 kg)  07/10/17 186 lb 4 oz (84.5 kg)  01/03/17 185 lb (83.9 kg)    Social History /Family History/Past Medical History reviewed in detail and updated in EMR if needed. Blood pressure 110/70, pulse 74, temperature 98.2 F (36.8 C), temperature source Oral, height 5\' 11"  (1.803 m), weight 190 lb 12 oz (86.5 kg).  Review of Systems  Constitutional: Negative for fatigue and fever.  HENT: Negative for ear pain.   Eyes: Negative for pain.  Respiratory: Negative for cough and shortness of breath.   Cardiovascular: Negative for chest pain, palpitations and leg swelling.  Gastrointestinal: Negative for abdominal pain.  Genitourinary: Negative for dysuria.  Musculoskeletal: Negative for arthralgias.  Neurological: Negative for syncope, light-headedness and headaches.  Psychiatric/Behavioral: Negative for dysphoric mood.       Objective:   Physical Exam  Constitutional: He appears well-developed and well-nourished.  Non-toxic appearance. He does not appear ill. No distress.  HENT:  Head: Normocephalic and atraumatic.  Right Ear: Hearing, tympanic membrane, external ear and ear canal normal.  Left Ear: Hearing, tympanic membrane, external ear and ear canal  normal.  Nose: Nose normal.  Mouth/Throat: Uvula is midline, oropharynx is clear and moist and mucous membranes are normal.  Eyes: Pupils are equal, round, and reactive to light. Conjunctivae, EOM and lids are normal. Lids are everted and swept, no foreign bodies found.  Neck: Trachea normal, normal range of motion and phonation normal. Neck supple. Carotid bruit is not present. No thyroid mass and no thyromegaly present.  Cardiovascular: Normal rate, regular rhythm, S1 normal, S2 normal, intact distal pulses and normal pulses. Exam reveals no gallop.  No murmur heard. Pulmonary/Chest: Breath sounds normal. He has no wheezes. He has no rhonchi. He has no rales.  Abdominal: Soft. Normal appearance and bowel sounds are normal. There is no hepatosplenomegaly. There is no tenderness. There is no rebound, no guarding and no CVA tenderness. No hernia.  Lymphadenopathy:    He has no cervical adenopathy.  Neurological: He is alert. He has normal strength and normal reflexes. No cranial nerve deficit or sensory deficit. Gait normal.  Skin: Skin is warm, dry and intact. No rash noted.  Psychiatric: He has a normal mood and affect. His speech is normal and behavior is normal. Judgment normal.          Assessment & Plan:  The patient's preventative maintenance and recommended screening tests for an annual wellness exam were reviewed in full today. Brought up to date unless services declined.  Counselled on the importance of diet, exercise, and its role in overall health and mortality. The patient's FH and SH was reviewed, including their home  life, tobacco status, and drug and alcohol status.   Nonsmoker.  Tetanus uptodate.  Given flu vaccine. No prostate cancer family history Nml colonoscopy 2015, Dr. Carlean Purl. Recall in 2023 at age 3 STD screen: Refused.

## 2018-07-16 NOTE — Assessment & Plan Note (Signed)
Encouraged exercise, weight loss, healthy eating habits. Take fish oil BID.

## 2018-09-04 ENCOUNTER — Other Ambulatory Visit: Payer: Self-pay | Admitting: Family Medicine

## 2018-09-04 NOTE — Telephone Encounter (Signed)
Last office visit 07/16/2018 for CPE.  Last refilled 07/08/2018 for #30 with 1 refill. Next Appt:  07/29/2019 for CPE.

## 2018-10-05 IMAGING — DX DG KNEE AP/LAT W/ SUNRISE*L*
4 series · 4 of 4 positions shown · non-contrast
Comparison: None in PACs

CLINICAL DATA: Chronic bilateral knee pain

EXAM:
LEFT KNEE 3 VIEWS

[knee ap]
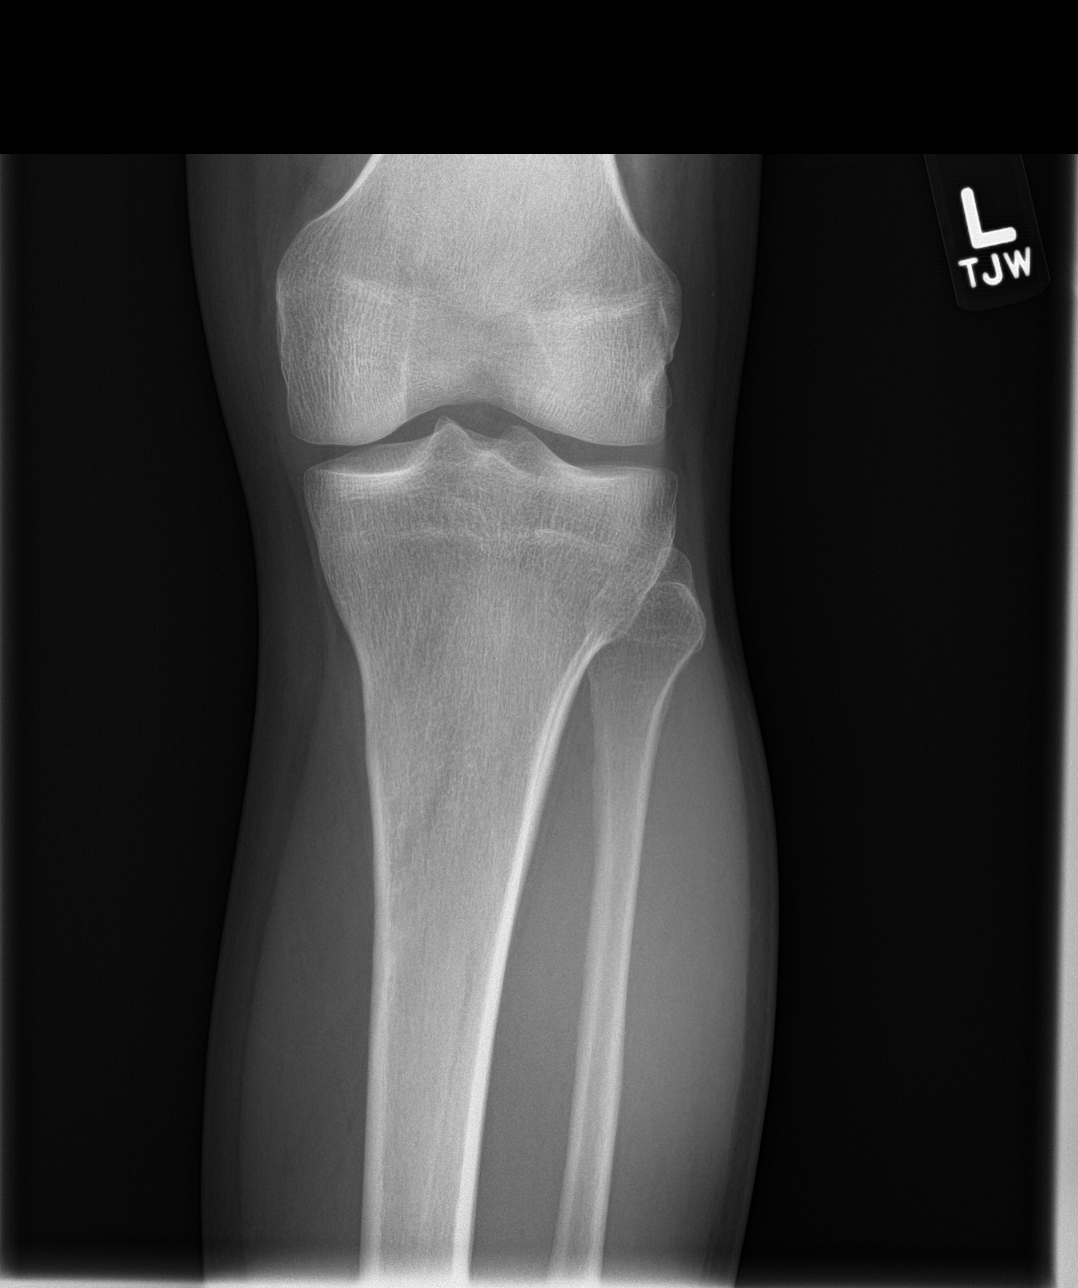

[knee lat]
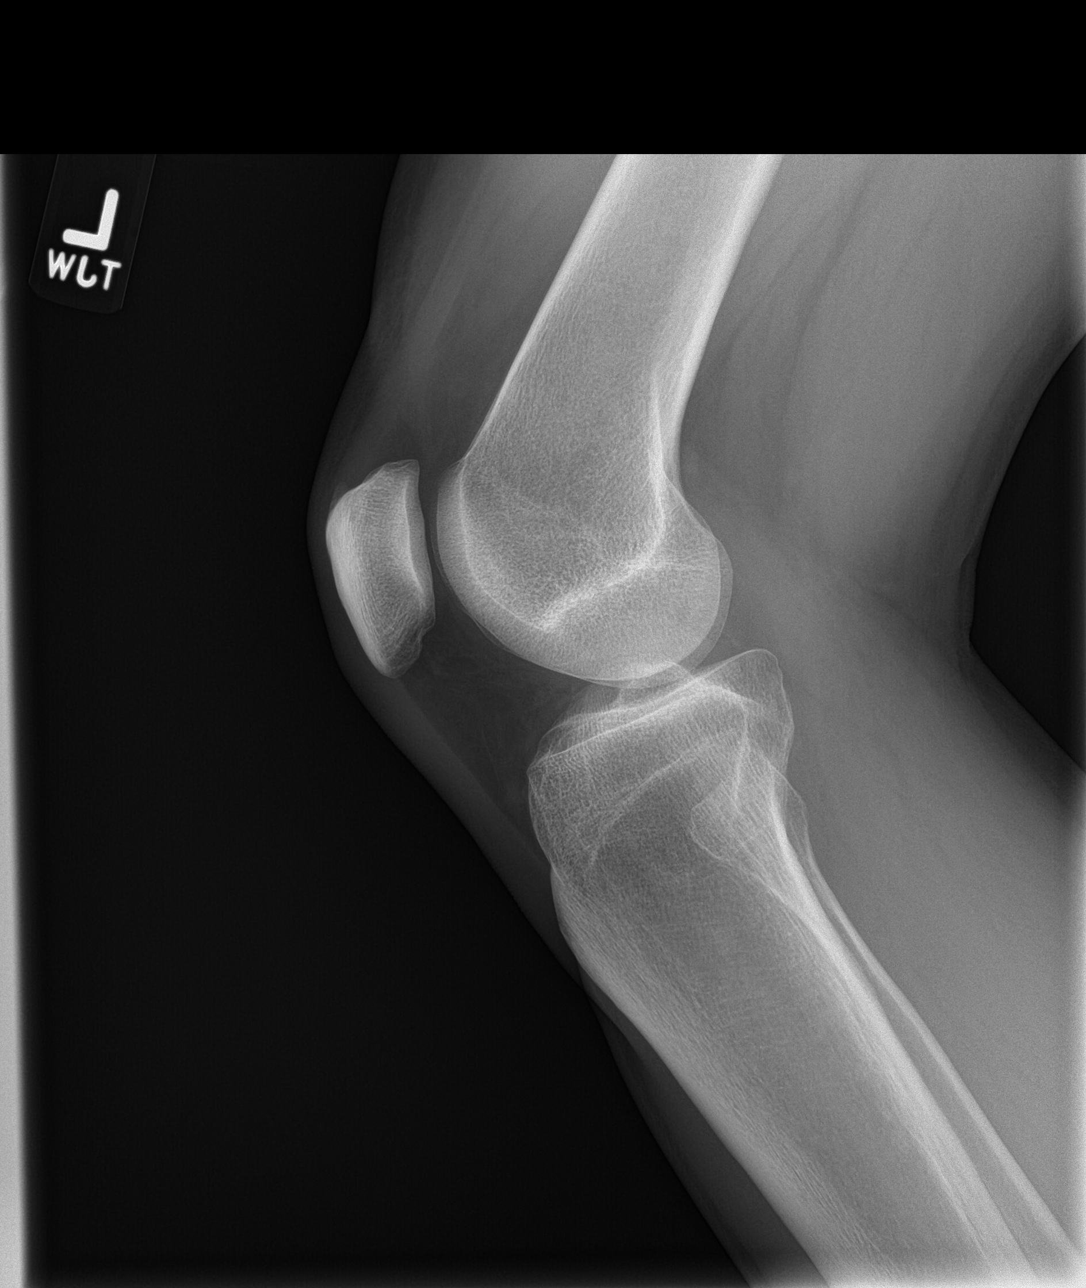

[patella skyline]
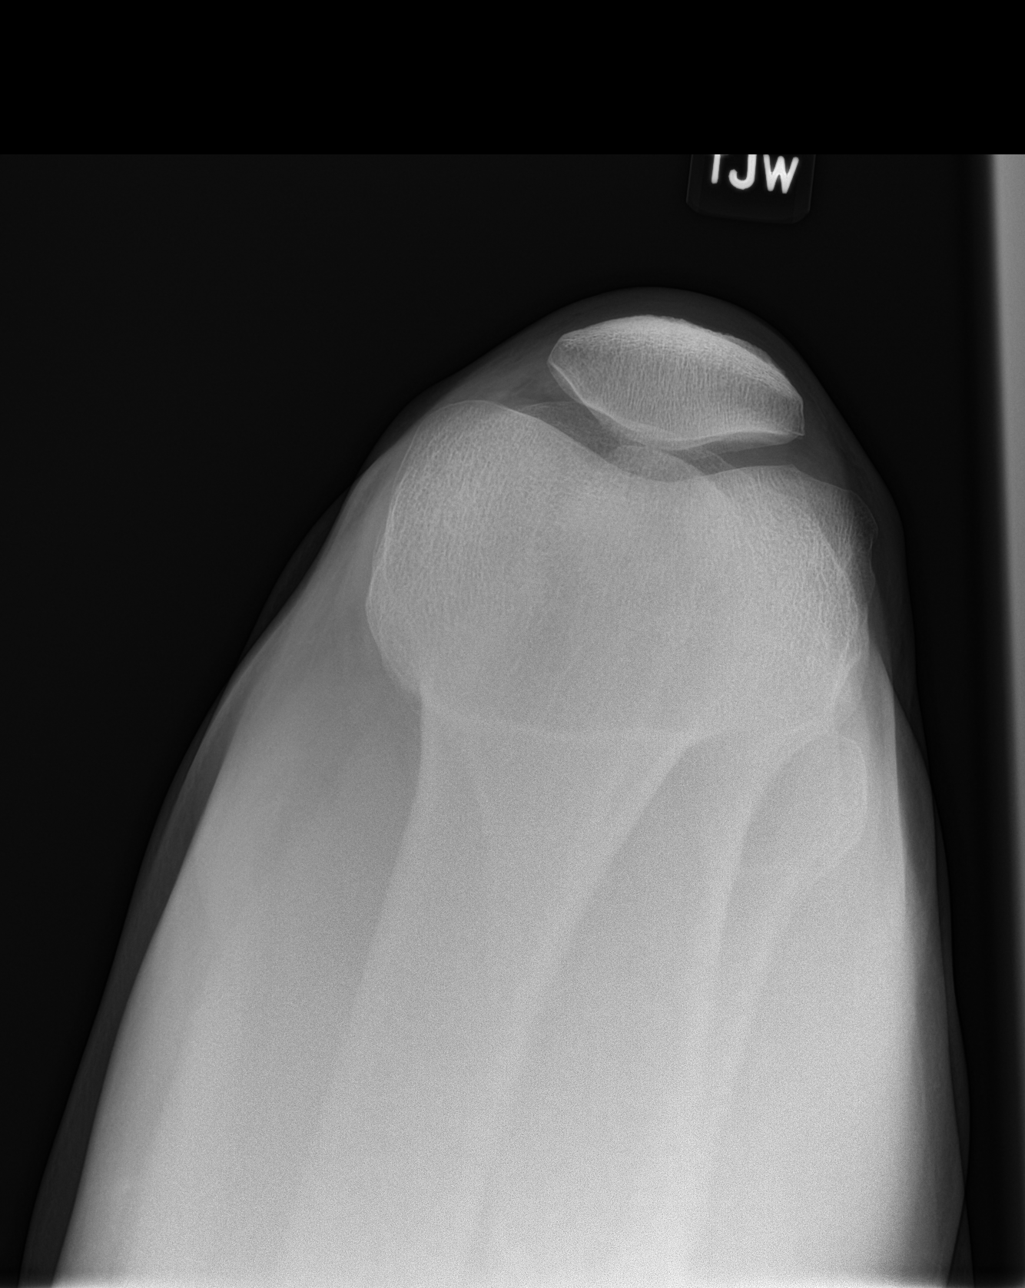

[knee obl]
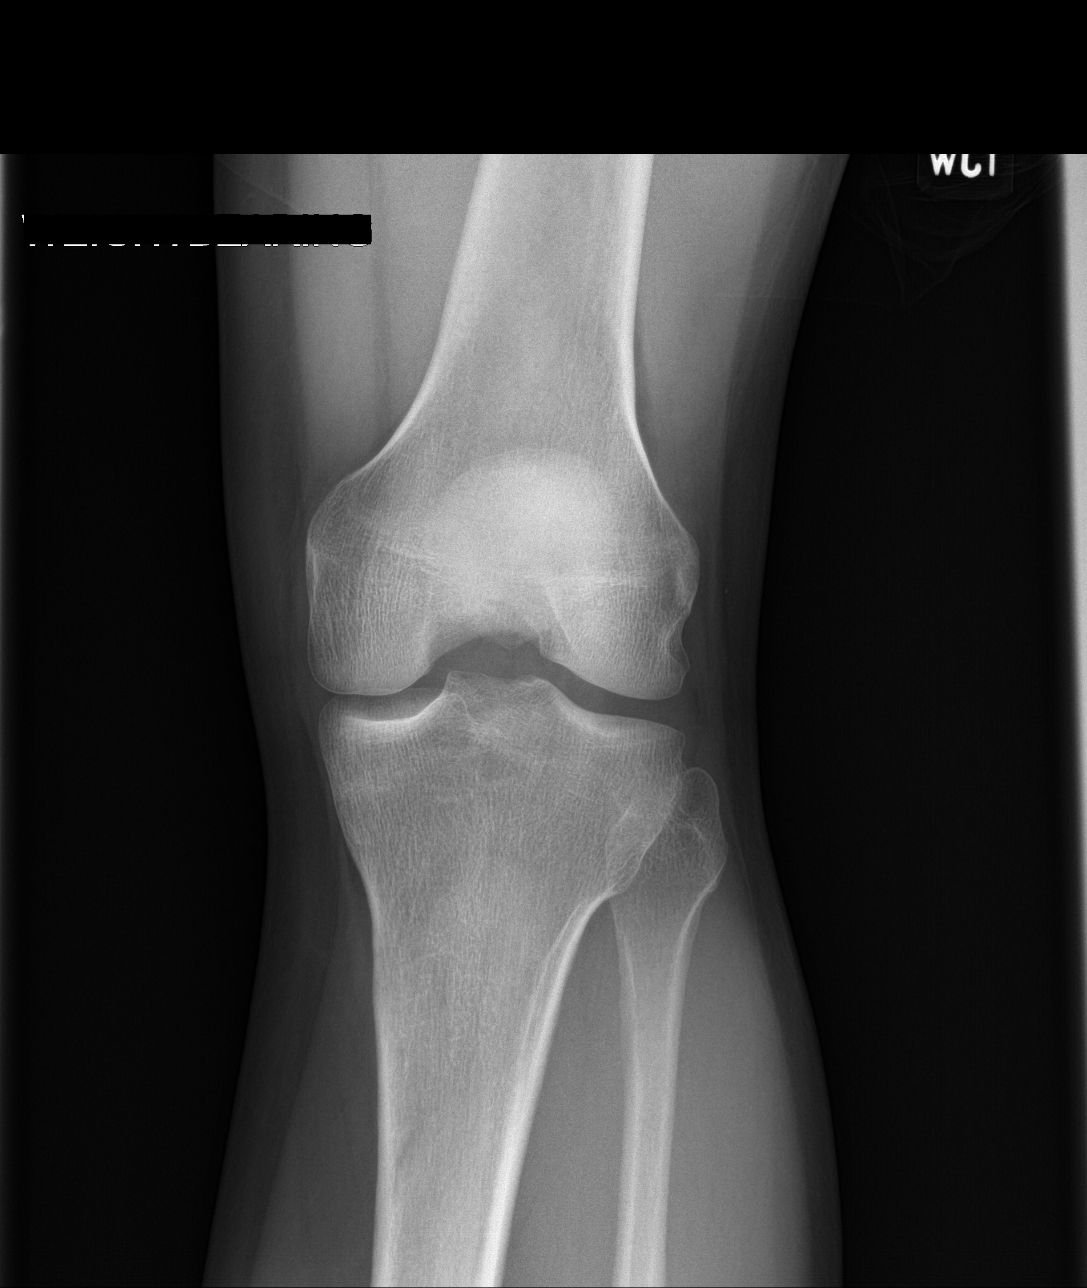

[4 of 4 positions shown; findings below may reference images not displayed]

FINDINGS: The bones are subjectively adequately mineralized. The joint spaces
are preserved. There is no chondrocalcinosis. There is no
significant osteophyte formation. There is no definite joint
effusion. There is no acute or old fracture or dislocation.
IMPRESSION: There is no acute or chronic bony abnormality of the left knee.

## 2019-01-13 ENCOUNTER — Other Ambulatory Visit: Payer: Self-pay | Admitting: Family Medicine

## 2019-01-13 NOTE — Telephone Encounter (Signed)
Last office visit 07/16/2018 for CPE.  Last refilled 09/04/2018 for #30 with 3 refills.  Next Appt: 07/29/2019 for CPE.

## 2019-04-11 ENCOUNTER — Other Ambulatory Visit: Payer: Self-pay | Admitting: Family Medicine

## 2019-05-12 ENCOUNTER — Other Ambulatory Visit: Payer: Self-pay | Admitting: Family Medicine

## 2019-05-12 NOTE — Telephone Encounter (Signed)
Last office visit 07/16/2018 for CPE.  Last refilled 01/13/2019 for #30 with 3 refills. CPE scheduled for 07/29/2019.

## 2019-07-10 ENCOUNTER — Other Ambulatory Visit: Payer: Self-pay | Admitting: Family Medicine

## 2019-07-22 ENCOUNTER — Other Ambulatory Visit: Payer: 59

## 2019-07-29 ENCOUNTER — Ambulatory Visit: Payer: 59 | Admitting: Family Medicine

## 2019-07-29 ENCOUNTER — Encounter: Payer: 59 | Admitting: Family Medicine

## 2019-08-20 ENCOUNTER — Other Ambulatory Visit: Payer: Self-pay

## 2019-08-20 ENCOUNTER — Encounter: Payer: Self-pay | Admitting: Family Medicine

## 2019-08-20 ENCOUNTER — Ambulatory Visit: Payer: 59 | Admitting: Family Medicine

## 2019-08-20 ENCOUNTER — Ambulatory Visit (INDEPENDENT_AMBULATORY_CARE_PROVIDER_SITE_OTHER)
Admission: RE | Admit: 2019-08-20 | Discharge: 2019-08-20 | Disposition: A | Discharge status: Home / Self Care | Payer: 59 | Source: Ambulatory Visit | Attending: Family Medicine | Admitting: Family Medicine

## 2019-08-20 VITALS — BP 122/80 | HR 72 | Temp 98.7°F | Resp 16 | Ht 71.0 in | Wt 195.0 lb

## 2019-08-20 DIAGNOSIS — R0781 Pleurodynia: Secondary | ICD-10-CM
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
## 2019-09-13 ENCOUNTER — Other Ambulatory Visit: Payer: Self-pay | Admitting: Family Medicine

## 2019-09-15 NOTE — Telephone Encounter (Signed)
Last office visit 08/20/2019 with Dr. Lorelei Pont for rib pain on right side.  Last refilled 05/13/2019 for #30 with 3 refills.  No future appointments.

## 2019-09-26 ENCOUNTER — Telehealth: Payer: Self-pay | Admitting: Family Medicine

## 2019-09-26 NOTE — Telephone Encounter (Signed)
Spoke with pt he is driving and will call back to [place]

## 2019-09-26 NOTE — Telephone Encounter (Signed)
I'm sorry,  Dr. Diona Browner.

## 2019-09-26 NOTE — Telephone Encounter (Signed)
Do you want me to schedule cpx with dr copland.  Dr Diona Browner is PCP??

## 2019-09-26 NOTE — Telephone Encounter (Signed)
Please try and schedule CPE with fasting labs prior with Dr. Lorelei Pont.

## 2020-01-15 ENCOUNTER — Other Ambulatory Visit: Payer: Self-pay | Admitting: Family Medicine

## 2020-01-15 NOTE — Telephone Encounter (Signed)
Last office visit 08/20/2019 with Dr. Lorelei Pont for rib pain right side.  Last refilled 09/16/2019 for #30 with 3 refills.  CPE scheduled for 08/05/2020.

## 2020-03-04 ENCOUNTER — Other Ambulatory Visit: Payer: Self-pay

## 2020-03-04 ENCOUNTER — Encounter: Payer: Self-pay | Admitting: Family Medicine

## 2020-03-04 ENCOUNTER — Ambulatory Visit: Payer: 59 | Admitting: Family Medicine

## 2020-03-04 VITALS — BP 124/90 | HR 65 | Temp 98.6°F | Ht 71.0 in | Wt 205.0 lb

## 2020-03-04 DIAGNOSIS — H60501 Unspecified acute noninfective otitis externa, right ear: Secondary | ICD-10-CM | POA: Diagnosis not present

## 2020-03-04 NOTE — Progress Notes (Signed)
Chief Complaint  Patient presents with  . Otalgia    Right    History of Present Illness: HPI   49 year old male presents for new onset right ear pain 5 day. Constant  Abnormality but intermittent ache.  He has noted some improvement in pain.  Is going out of town soon.  No  current change in hearing, was intermittently muffled, now better.  No discharge.  He denies  fever, no SOB, no cough. No sore throat.  Has some nasal congestion from allergies. Using flonase daily one spray, allegra.   has tried OTC ear ache med.   This visit occurred during the SARS-CoV-2 public health emergency.  Safety protocols were in place, including screening questions prior to the visit, additional usage of staff PPE, and extensive cleaning of exam room while observing appropriate contact time as indicated for disinfecting solutions.   COVID 19 screen:  No recent travel or known exposure to COVID19 The patient denies respiratory symptoms of COVID 19 at this time. The importance of social distancing was discussed today.     Review of Systems  Constitutional: Negative for chills and fever.  HENT: Positive for ear pain. Negative for congestion and ear discharge.   Eyes: Negative for pain and redness.  Respiratory: Negative for cough and shortness of breath.   Cardiovascular: Negative for chest pain, palpitations and leg swelling.  Gastrointestinal: Negative for abdominal pain, blood in stool, constipation, diarrhea, nausea and vomiting.  Genitourinary: Negative for dysuria.  Musculoskeletal: Negative for falls and myalgias.  Skin: Negative for rash.  Neurological: Negative for dizziness.  Psychiatric/Behavioral: Negative for depression. The patient is not nervous/anxious.       Past Medical History:  Diagnosis Date  . Colon polyp   . History of eczema   . IBS (irritable bowel syndrome)     reports that he has never smoked. He has quit using smokeless tobacco. He reports current alcohol  use. He reports that he does not use drugs.   Current Outpatient Medications:  .  Clocortolone Pivalate (CLODERM) 0.1 % cream, , Disp: , Rfl:  .  fexofenadine (ALLEGRA) 180 MG tablet, Take 180 mg by mouth daily., Disp: , Rfl:  .  fluticasone (CUTIVATE) 0.005 % ointment, Apply topically 2 (two) times daily as needed. , Disp: , Rfl:  .  fluticasone (FLONASE) 50 MCG/ACT nasal spray, SPRAY 2 SPRAYS INTO EACH NOSTRIL EVERY DAY, Disp: 16 mL, Rfl: 2 .  loperamide (IMODIUM) 2 MG capsule, Take 2 mg by mouth 2 (two) times daily as needed. , Disp: , Rfl:  .  meloxicam (MOBIC) 15 MG tablet, TAKE 1 TABLET BY MOUTH EVERY DAY, Disp: 30 tablet, Rfl: 3 .  Omega-3 Fatty Acids (FISH OIL PO), Take 1200 by mouth, two in am and one pm, Disp: , Rfl:    Observations/Objective: Blood pressure 124/90, pulse 65, temperature 98.6 F (37 C), temperature source Temporal, height 5\' 11"  (1.803 m), weight 205 lb (93 kg), SpO2 97 %.  Physical Exam Constitutional:      Appearance: He is well-developed.  HENT:     Head: Normocephalic.     Right Ear: Hearing normal.     Left Ear: Hearing normal.     Nose: Nose normal.  Neck:     Thyroid: No thyroid mass or thyromegaly.     Vascular: No carotid bruit.     Trachea: Trachea normal.  Cardiovascular:     Rate and Rhythm: Normal rate and regular rhythm.  Pulses: Normal pulses.     Heart sounds: Heart sounds not distant. No murmur. No friction rub. No gallop.      Comments: No peripheral edema Pulmonary:     Effort: Pulmonary effort is normal. No respiratory distress.     Breath sounds: Normal breath sounds.  Skin:    General: Skin is warm and dry.     Findings: No rash.  Psychiatric:        Speech: Speech normal.        Behavior: Behavior normal.        Thought Content: Thought content normal.      Assessment and Plan   Acute otitis externa of right ear Seems to have resolved with OTC treatment. Pt pt pain is much better, basically gone.  If returns he will  call for antibiotic drops.     Eliezer Lofts, MD

## 2020-03-04 NOTE — Assessment & Plan Note (Signed)
Seems to have resolved with OTC treatment. Pt pt pain is much better, basically gone.  If returns he will call for antibiotic drops.

## 2020-03-04 NOTE — Patient Instructions (Signed)
Call if ear pain issues return.  Use Flonase 2 sprays per nostril and or decongestant prior to plane flight.

## 2020-05-18 ENCOUNTER — Other Ambulatory Visit: Payer: Self-pay | Admitting: Family Medicine

## 2020-05-18 NOTE — Telephone Encounter (Signed)
Last office visit 03/04/2020 for otitis externa.  Last refilled 01/15/2020 for #30 with 3 refills.  CPE scheduled for 08/05/2020.

## 2020-07-13 ENCOUNTER — Encounter: Payer: Self-pay | Admitting: Family Medicine

## 2020-07-13 ENCOUNTER — Ambulatory Visit: Payer: 59 | Admitting: Family Medicine

## 2020-07-13 ENCOUNTER — Other Ambulatory Visit: Payer: Self-pay

## 2020-07-13 VITALS — BP 110/80 | HR 73 | Temp 99.7°F | Ht 71.0 in | Wt 208.8 lb

## 2020-07-13 DIAGNOSIS — G8929 Other chronic pain: Secondary | ICD-10-CM | POA: Insufficient documentation

## 2020-07-13 DIAGNOSIS — M25522 Pain in left elbow: Secondary | ICD-10-CM

## 2020-07-13 DIAGNOSIS — M722 Plantar fascial fibromatosis: Secondary | ICD-10-CM | POA: Insufficient documentation

## 2020-07-13 NOTE — Patient Instructions (Signed)
Start ice massage and home physical therapy on left foot.  Wear supportive shoes. Continue meloxicam prn.  Can try topical OTC Voltaren gel on  Foot or elbow if needed for pain control.

## 2020-07-13 NOTE — Assessment & Plan Note (Signed)
NSAID, ice massage and home PT. Supportive shoes.  Follow up if not improvnig as expected.

## 2020-07-13 NOTE — Assessment & Plan Note (Signed)
NSAIDS, gentle ROM stretching.
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
## 2020-07-29 ENCOUNTER — Other Ambulatory Visit: Payer: Self-pay

## 2020-07-29 ENCOUNTER — Telehealth: Payer: Self-pay | Admitting: Family Medicine

## 2020-07-29 ENCOUNTER — Other Ambulatory Visit (INDEPENDENT_AMBULATORY_CARE_PROVIDER_SITE_OTHER): Payer: 59

## 2020-07-29 DIAGNOSIS — E781 Pure hyperglyceridemia: Secondary | ICD-10-CM

## 2020-07-29 DIAGNOSIS — Z125 Encounter for screening for malignant neoplasm of prostate: Secondary | ICD-10-CM

## 2020-07-29 LAB — LIPID PANEL
Cholesterol: 229 mg/dL — ABNORMAL HIGH (ref 0–200)
HDL: 43.6 mg/dL (ref >39.00)
NonHDL: 185.85
Total CHOL/HDL Ratio: 5
Triglycerides: 243 mg/dL — ABNORMAL HIGH (ref 0.0–149.0)
VLDL: 48.6 mg/dL — ABNORMAL HIGH (ref 0.0–40.0)

## 2020-07-29 LAB — LDL CHOLESTEROL, DIRECT: Direct LDL: 114 mg/dL

## 2020-07-29 NOTE — Telephone Encounter (Signed)
-----   Message from Ellamae Sia sent at 07/14/2020 12:11 PM EDT ----- Regarding: Lab orders for Thursday, 10.21.21 Patient is scheduled for CPX labs, please order future labs, Thanks , Karna Christmas

## 2020-07-30 LAB — COMPREHENSIVE METABOLIC PANEL
ALT: 25 U/L (ref 0–53)
AST: 22 U/L (ref 0–37)
Albumin: 4.2 g/dL (ref 3.5–5.2)
Alkaline Phosphatase: 59 U/L (ref 39–117)
BUN: 18 mg/dL (ref 6–23)
CO2: 30 mEq/L (ref 19–32)
Calcium: 8.9 mg/dL (ref 8.4–10.5)
Chloride: 104 mEq/L (ref 96–112)
Creatinine, Ser: 1.07 mg/dL (ref 0.40–1.50)
GFR: 86 mL/min (ref >60.00)
Glucose, Bld: 97 mg/dL (ref 70–99)
Potassium: 4.6 mEq/L (ref 3.5–5.1)
Sodium: 141 mEq/L (ref 135–145)
Total Bilirubin: 0.5 mg/dL (ref 0.2–1.2)
Total Protein: 6.4 g/dL (ref 6.0–8.3)

## 2020-07-30 NOTE — Progress Notes (Signed)
No critical labs need to be addressed urgently. We will discuss labs in detail at upcoming office visit.   

## 2020-08-05 ENCOUNTER — Other Ambulatory Visit: Payer: Self-pay

## 2020-08-05 ENCOUNTER — Encounter: Payer: Self-pay | Admitting: Family Medicine

## 2020-08-05 ENCOUNTER — Ambulatory Visit (INDEPENDENT_AMBULATORY_CARE_PROVIDER_SITE_OTHER): Payer: 59 | Admitting: Family Medicine

## 2020-08-05 VITALS — BP 114/76 | HR 76 | Temp 98.5°F | Ht 71.0 in | Wt 206.8 lb

## 2020-08-05 DIAGNOSIS — Z23 Encounter for immunization: Secondary | ICD-10-CM

## 2020-08-05 DIAGNOSIS — Z0000 Encounter for general adult medical examination without abnormal findings: Secondary | ICD-10-CM | POA: Diagnosis not present

## 2020-08-05 NOTE — Patient Instructions (Signed)
Preventive Care 41-48 Years Old, Male Preventive care refers to lifestyle choices and visits with your health care provider that can promote health and wellness. This includes:  A yearly physical exam. This is also called an annual well check.  Regular dental and eye exams.  Immunizations.  Screening for certain conditions.  Healthy lifestyle choices, such as eating a healthy diet, getting regular exercise, not using drugs or products that contain nicotine and tobacco, and limiting alcohol use. What can I expect for my preventive care visit? Physical exam Your health care provider will check:  Height and weight. These may be used to calculate body mass index (BMI), which is a measurement that tells if you are at a healthy weight.  Heart rate and blood pressure.  Your skin for abnormal spots. Counseling Your health care provider may ask you questions about:  Alcohol, tobacco, and drug use.  Emotional well-being.  Home and relationship well-being.  Sexual activity.  Eating habits.  Work and work Statistician. What immunizations do I need?  Influenza (flu) vaccine  This is recommended every year. Tetanus, diphtheria, and pertussis (Tdap) vaccine  You may need a Td booster every 10 years. Varicella (chickenpox) vaccine  You may need this vaccine if you have not already been vaccinated. Zoster (shingles) vaccine  You may need this after age 64. Measles, mumps, and rubella (MMR) vaccine  You may need at least one dose of MMR if you were born in 1957 or later. You may also need a second dose. Pneumococcal conjugate (PCV13) vaccine  You may need this if you have certain conditions and were not previously vaccinated. Pneumococcal polysaccharide (PPSV23) vaccine  You may need one or two doses if you smoke cigarettes or if you have certain conditions. Meningococcal conjugate (MenACWY) vaccine  You may need this if you have certain conditions. Hepatitis A  vaccine  You may need this if you have certain conditions or if you travel or work in places where you may be exposed to hepatitis A. Hepatitis B vaccine  You may need this if you have certain conditions or if you travel or work in places where you may be exposed to hepatitis B. Haemophilus influenzae type b (Hib) vaccine  You may need this if you have certain risk factors. Human papillomavirus (HPV) vaccine  If recommended by your health care provider, you may need three doses over 6 months. You may receive vaccines as individual doses or as more than one vaccine together in one shot (combination vaccines). Talk with your health care provider about the risks and benefits of combination vaccines. What tests do I need? Blood tests  Lipid and cholesterol levels. These may be checked every 5 years, or more frequently if you are over 60 years old.  Hepatitis C test.  Hepatitis B test. Screening  Lung cancer screening. You may have this screening every year starting at age 43 if you have a 30-pack-year history of smoking and currently smoke or have quit within the past 15 years.  Prostate cancer screening. Recommendations will vary depending on your family history and other risks.  Colorectal cancer screening. All adults should have this screening starting at age 72 and continuing until age 2. Your health care provider may recommend screening at age 14 if you are at increased risk. You will have tests every 1-10 years, depending on your results and the type of screening test.  Diabetes screening. This is done by checking your blood sugar (glucose) after you have not eaten  for a while (fasting). You may have this done every 1-3 years.  Sexually transmitted disease (STD) testing. Follow these instructions at home: Eating and drinking  Eat a diet that includes fresh fruits and vegetables, whole grains, lean protein, and low-fat dairy products.  Take vitamin and mineral supplements as  recommended by your health care provider.  Do not drink alcohol if your health care provider tells you not to drink.  If you drink alcohol: ? Limit how much you have to 0-2 drinks a day. ? Be aware of how much alcohol is in your drink. In the [place] one drink equals one 12 oz bottle of beer (355 mL), one 5 oz glass of wine (148 mL), or one 1 oz glass of hard liquor (44 mL). Lifestyle  Take daily care of your teeth and gums.  Stay active. Exercise for at least 30 minutes on 5 or more days each week.  Do not use any products that contain nicotine or tobacco, such as cigarettes, e-cigarettes, and chewing tobacco. If you need help quitting, ask your health care provider.  If you are sexually active, practice safe sex. Use a condom or other form of protection to prevent STIs (sexually transmitted infections).  Talk with your health care provider about taking a low-dose aspirin every day starting at age 53. What's next?  Go to your health care provider once a year for a well check visit.  Ask your health care provider how often you should have your eyes and teeth checked.  Stay up to date on all vaccines. This information is not intended to replace advice given to you by your health care provider. Make sure you discuss any questions you have with your health care provider. Document Revised: 09/19/2018 Document Reviewed: 09/19/2018 Elsevier Patient Education  2020 [employer].

## 2020-08-05 NOTE — Progress Notes (Signed)
Chief Complaint  Patient presents with   Annual Exam    History of Present Illness: HPI   The patient is here for annual wellness exam and preventative care.     Foot ing meloxicam for chronic elbow pain.  Hypertriglyceridemia: on fish oil daily. Lab Results  Component Value Date   CHOL 229 (H) 07/29/2020   HDL 43.60 07/29/2020   LDLCALC 133 (H) 07/03/2017   LDLDIRECT 114.0 07/29/2020   TRIG 243.0 (H) 07/29/2020   CHOLHDL 5 07/29/2020  Diet:  Moderate Exercise: getting back to riding.  The 10-year ASCVD risk score Mikey Bussing DC Brooke Bonito., et al., 2013) is: 3%   Values used to calculate the score:     Age: 49 years     Sex: Male     Is Non-Hispanic African American: No     Diabetic: No     Tobacco smoker: No     Systolic Blood Pressure: 902 mmHg     Is BP treated: No     HDL Cholesterol: 43.6 mg/dL     Total Cholesterol: 229 mg/dL    This visit occurred during the SARS-CoV-2 public health emergency.  Safety protocols were in place, including screening questions prior to the visit, additional usage of staff PPE, and extensive cleaning of exam room while observing appropriate contact time as indicated for disinfecting solutions.   COVID 19 screen:  No recent travel or known exposure to COVID19 The patient denies respiratory symptoms of COVID 19 at this time. The importance of social distancing was discussed today.     Review of Systems  Constitutional: Negative for chills and fever.  HENT: Negative for congestion and ear pain.   Eyes: Negative for pain and redness.  Respiratory: Negative for cough and shortness of breath.   Cardiovascular: Negative for chest pain, palpitations and leg swelling.  Gastrointestinal: Negative for abdominal pain, blood in stool, constipation, diarrhea, nausea and vomiting.  Genitourinary: Negative for dysuria.  Musculoskeletal: Negative for falls and myalgias.  Skin: Negative for rash.  Neurological: Negative for dizziness.   Psychiatric/Behavioral: Negative for depression. The patient is not nervous/anxious.       Past Medical History:  Diagnosis Date   Colon polyp    History of eczema    IBS (irritable bowel syndrome)     reports that he has never smoked. He has quit using smokeless tobacco. He reports current alcohol use. He reports that he does not use drugs.   Current Outpatient Medications:    Clocortolone Pivalate (CLODERM) 0.1 % cream, , Disp: , Rfl:    fexofenadine (ALLEGRA) 180 MG tablet, Take 180 mg by mouth daily., Disp: , Rfl:    fluticasone (CUTIVATE) 0.005 % ointment, Apply topically 2 (two) times daily as needed. , Disp: , Rfl:    fluticasone (FLONASE) 50 MCG/ACT nasal spray, SPRAY 2 SPRAYS INTO EACH NOSTRIL EVERY DAY, Disp: 16 mL, Rfl: 2   loperamide (IMODIUM) 2 MG capsule, Take 2 mg by mouth 2 (two) times daily as needed. , Disp: , Rfl:    meloxicam (MOBIC) 15 MG tablet, TAKE 1 TABLET BY MOUTH EVERY DAY, Disp: 30 tablet, Rfl: 3   Omega-3 Fatty Acids (FISH OIL PO), Take 1200 by mouth, two in am and one pm, Disp: , Rfl:    Observations/Objective: Blood pressure 114/76, pulse 76, temperature 98.5 F (36.9 C), temperature source Temporal, height 5\' 11"  (1.803 m), weight 206 lb 12 oz (93.8 kg), SpO2 97 %.  Physical Exam Constitutional:  Appearance: He is well-developed.  HENT:     Head: Normocephalic.     Right Ear: Hearing normal.     Left Ear: Hearing normal.     Nose: Nose normal.  Neck:     Thyroid: No thyroid mass or thyromegaly.     Vascular: No carotid bruit.     Trachea: Trachea normal.  Cardiovascular:     Rate and Rhythm: Normal rate and regular rhythm.     Pulses: Normal pulses.     Heart sounds: Heart sounds not distant. No murmur heard.  No friction rub. No gallop.      Comments: No peripheral edema Pulmonary:     Effort: Pulmonary effort is normal. No respiratory distress.     Breath sounds: Normal breath sounds.  Skin:    General: Skin is warm and  dry.     Findings: No rash.  Psychiatric:        Speech: Speech normal.        Behavior: Behavior normal.        Thought Content: Thought content normal.      Assessment and Plan The patient's preventative maintenance and recommended screening tests for an annual wellness exam were reviewed in full today. Brought up to date unless services declined.  Counselled on the importance of diet, exercise, and its role in overall health and mortality. The patient's FH and SH was reviewed, including their home life, tobacco status, and drug and alcohol status.     Nonsmoker.  Tdap given.  Given flu vaccine.  Discussed COVID19 vaccine side effects and benefits. Strongly encouraged the patient to get the vaccine. Questions answered. No prostate cancer family history Nml colonoscopy 2015, Dr. Carlean Purl. Recall in 2023 at age 64 STD screen: Refused.  Hep C:   plan next year  Eliezer Lofts, MD

## 2020-09-07 ENCOUNTER — Other Ambulatory Visit: Payer: Self-pay | Admitting: Family Medicine

## 2020-09-07 NOTE — Telephone Encounter (Signed)
Last office visit 08/05/2020 for CPE.  Last refilled 05/18/2020 for #30 with 3 refills.  No future appointments.

## 2021-01-06 ENCOUNTER — Other Ambulatory Visit: Payer: Self-pay | Admitting: Family Medicine

## 2021-01-06 NOTE — Telephone Encounter (Signed)
Last office visit 08/05/2020 for CPE.  Last refilled 09/07/2020 for #30 with 3 refills.  CPE scheduled for 08/09/2021.

## 2021-03-11 ENCOUNTER — Other Ambulatory Visit: Payer: Self-pay

## 2021-03-11 ENCOUNTER — Encounter: Payer: Self-pay | Admitting: Family Medicine

## 2021-03-11 ENCOUNTER — Ambulatory Visit: Payer: 59 | Admitting: Family Medicine

## 2021-03-11 VITALS — BP 132/78 | HR 77 | Temp 98.5°F | Ht 71.0 in | Wt 207.0 lb

## 2021-03-11 DIAGNOSIS — M25562 Pain in left knee: Secondary | ICD-10-CM

## 2021-03-11 DIAGNOSIS — G8929 Other chronic pain: Secondary | ICD-10-CM

## 2021-03-11 DIAGNOSIS — M25561 Pain in right knee: Secondary | ICD-10-CM | POA: Diagnosis not present

## 2021-03-11 NOTE — Progress Notes (Signed)
Patient ID: Raymond Lloyd, male    DOB: [birth year], 50 y.o.   MRN: [id number]  This visit was conducted in person.  BP 132/78   Pulse 77   Temp 98.5 F (36.9 C) (Temporal)   Ht 5\' 11"  (1.803 m)   Wt 207 lb (93.9 kg)   SpO2 97%   BMI 28.87 kg/m    CC:  Chief Complaint  Patient presents with  . Knee Pain    Subjective:   HPI: Raymond Lloyd is a 50 y.o. male presenting on 03/11/2021 for Knee Pain   He has chronic pain in bilateral knees, medial.... post traumatic. Worsening over the last 9-12 months.  After riding tractor.[place] [employer] after using clutch.. had flare of pain in knees.  More frequent flares. Decreased knee mobility. Cannot stand from squatting.  it is now interfering with his activity and functioning.   Pain greatest with squatting.  No locking, but they pop all the time... occ feel like they give.  No redness, no swellling.  No new injury. No falls.   2017 Bilateral  X-ray:  No OA, no fracture   No better with  Tyelnol, ibuprofen, aleve,meloxicam, glucosamine chondonitin for years, maybe helps temporraily      Relevant past medical, surgical, family and social history reviewed and updated as indicated. Interim medical history since our last visit reviewed. Allergies and medications reviewed and updated. Outpatient Medications Prior to Visit  Medication Sig Dispense Refill  . Clocortolone Pivalate (CLODERM) 0.1 % cream     . fexofenadine (ALLEGRA) 180 MG tablet Take 180 mg by mouth daily.    . fluticasone (CUTIVATE) 0.005 % ointment Apply topically 2 (two) times daily as needed.     . fluticasone (FLONASE) 50 MCG/ACT nasal spray SPRAY 2 SPRAYS INTO EACH NOSTRIL EVERY DAY 16 mL 2  . loperamide (IMODIUM) 2 MG capsule Take 2 mg by mouth 2 (two) times daily as needed.     . meloxicam (MOBIC) 15 MG tablet TAKE 1 TABLET BY MOUTH EVERY DAY 30 tablet 3  . Omega-3 Fatty Acids (FISH OIL PO) Take 1200 by mouth, two in am and one pm     No facility-administered medications  prior to visit.     Per HPI unless specifically indicated in ROS section below Review of Systems  Constitutional: Negative for chills and fever.  HENT: Negative for congestion and ear pain.   Eyes: Negative for pain and redness.  Respiratory: Negative for cough and shortness of breath.   Cardiovascular: Negative for chest pain, palpitations and leg swelling.  Gastrointestinal: Negative for abdominal pain, blood in stool, constipation, diarrhea, nausea and vomiting.  Genitourinary: Negative for dysuria.  Musculoskeletal: Negative for myalgias.  Skin: Negative for rash.  Neurological: Negative for dizziness.  Psychiatric/Behavioral: The patient is not nervous/anxious.    Objective:  BP 132/78   Pulse 77   Temp 98.5 F (36.9 C) (Temporal)   Ht 5\' 11"  (1.803 m)   Wt 207 lb (93.9 kg)   SpO2 97%   BMI 28.87 kg/m   Wt Readings from Last 3 Encounters:  03/11/21 207 lb (93.9 kg)  08/05/20 206 lb 12 oz (93.8 kg)  07/13/20 208 lb 12 oz (94.7 kg)      Physical Exam Constitutional:      Appearance: He is well-developed.  HENT:     Head: Normocephalic.     Right Ear: Hearing normal.     Left Ear: Hearing normal.     Nose:  Nose normal.  Neck:     Thyroid: No thyroid mass or thyromegaly.     Vascular: No carotid bruit.     Trachea: Trachea normal.  Cardiovascular:     Rate and Rhythm: Normal rate and regular rhythm.     Pulses: Normal pulses.     Heart sounds: Heart sounds not distant. No murmur heard. No friction rub. No gallop.      Comments: No peripheral edema Pulmonary:     Effort: Pulmonary effort is normal. No respiratory distress.     Breath sounds: Normal breath sounds.  Musculoskeletal:     Right knee: No effusion or erythema. Normal range of motion. No LCL laxity, MCL laxity, ACL laxity or PCL laxity. Abnormal meniscus. Normal patellar mobility.     Instability Tests: Anterior drawer test negative. Posterior drawer test negative. Anterior Lachman test negative.      Left knee: Normal. No effusion or erythema. Normal range of motion. No LCL laxity, MCL laxity, ACL laxity or PCL laxity.Normal meniscus and normal patellar mobility.     Instability Tests: Anterior drawer test negative. Posterior drawer test negative. Anterior Lachman test negative.     Right lower leg: Normal.     Left lower leg: Normal.     Right ankle: Normal.     Right Achilles Tendon: Normal.     Left ankle: Normal.     Left Achilles Tendon: Normal.  Skin:    General: Skin is warm and dry.     Findings: No rash.  Psychiatric:        Speech: Speech normal.        Behavior: Behavior normal.        Thought Content: Thought content normal.       Results for orders placed or performed in visit on 07/29/20  Comprehensive metabolic panel  Result Value Ref Range   Sodium 141 135 - 145 mEq/L   Potassium 4.6 3.5 - 5.1 mEq/L   Chloride 104 96 - 112 mEq/L   CO2 30 19 - 32 mEq/L   Glucose, Bld 97 70 - 99 mg/dL   BUN 18 6 - 23 mg/dL   Creatinine, Ser 1.07 0.40 - 1.50 mg/dL   Total Bilirubin 0.5 0.2 - 1.2 mg/dL   Alkaline Phosphatase 59 39 - 117 U/L   AST 22 0 - 37 U/L   ALT 25 0 - 53 U/L   Total Protein 6.4 6.0 - 8.3 g/dL   Albumin 4.2 3.5 - 5.2 g/dL   GFR 86.00 >60.00 mL/min   Calcium 8.9 8.4 - 10.5 mg/dL  Lipid panel  Result Value Ref Range   Cholesterol 229 (H) 0 - 200 mg/dL   Triglycerides 243.0 (H) 0.0 - 149.0 mg/dL   HDL 43.60 >39.00 mg/dL   VLDL 48.6 (H) 0.0 - 40.0 mg/dL   Total CHOL/HDL Ratio 5    NonHDL 185.85   LDL cholesterol, direct  Result Value Ref Range   Direct LDL 114.0 mg/dL    This visit occurred during the SARS-CoV-2 public health emergency.  Safety protocols were in place, including screening questions prior to the visit, additional usage of staff PPE, and extensive cleaning of exam room while observing appropriate contact time as indicated for disinfecting solutions.   COVID 19 screen:  No recent travel or known exposure to COVID19 The patient denies  respiratory symptoms of COVID 19 at this time. The importance of social distancing was discussed today.   Assessment and Plan    Problem  List Items Addressed This Visit    Chronic pain of both knees - Primary    Failed mulitple NSAIDs as well as meloxicam.  No improvement with glucosamine.   Xray did not show arthritis.   Popping with McMurray on right.. ? Old meniscal injury   Refer to Ortho for eval and treat.       Relevant Orders   Ambulatory referral to Orthopedic Surgery       Eliezer Lofts, MD

## 2021-03-11 NOTE — Assessment & Plan Note (Signed)
Failed mulitple NSAIDs as well as meloxicam.  No improvement with glucosamine.   Xray did not show arthritis.   Popping with McMurray on right.. ? Old meniscal injury   Refer to Ortho for eval and treat.

## 2021-03-11 NOTE — Patient Instructions (Signed)
We will work on  Getting you set up with an Doctor, general practice.  Can use OTC Voltaren gel four times daily for knee pain.

## 2021-03-17 ENCOUNTER — Ambulatory Visit: Payer: Self-pay

## 2021-03-17 ENCOUNTER — Ambulatory Visit: Payer: 59 | Admitting: Orthopaedic Surgery

## 2021-03-17 ENCOUNTER — Other Ambulatory Visit: Payer: Self-pay

## 2021-03-17 DIAGNOSIS — M25562 Pain in left knee: Secondary | ICD-10-CM | POA: Diagnosis not present

## 2021-03-17 DIAGNOSIS — M170 Bilateral primary osteoarthritis of knee: Secondary | ICD-10-CM | POA: Diagnosis not present

## 2021-03-17 DIAGNOSIS — G8929 Other chronic pain: Secondary | ICD-10-CM

## 2021-03-17 DIAGNOSIS — M25561 Pain in right knee: Secondary | ICD-10-CM

## 2021-03-17 MED ORDER — METHYLPREDNISOLONE ACETATE 40 MG/ML IJ SUSP
13.3300 mg | INTRAMUSCULAR | Status: AC | PRN
  Administered 2021-03-17: 13.33 mg via INTRA_ARTICULAR

## 2021-03-17 MED ORDER — LIDOCAINE HCL 1 % IJ SOLN
3.0000 mL | INTRAMUSCULAR | Status: AC | PRN
  Administered 2021-03-17: 3 mL

## 2021-03-17 MED ORDER — BUPIVACAINE HCL 0.25 % IJ SOLN
0.6600 mL | INTRAMUSCULAR | Status: AC | PRN
  Administered 2021-03-17: .66 mL via INTRA_ARTICULAR

## 2021-03-17 NOTE — Progress Notes (Signed)
Office Visit Note   Patient: Raymond Lloyd           Date of Birth: 1970-12-21           MRN: [id number] Visit Date: 03/17/2021              Requested by: Jinny Sanders, MD [place],  [place] [id number] PCP: Jinny Sanders, MD   Assessment & Plan: Visit Diagnoses:  1. Bilateral primary osteoarthritis of knee   2. Chronic pain of both knees     Plan: Impression is chronic bilateral knee pain likely from underlying osteoarthritis.  I think his symptoms are more consistent with osteoarthritis rather than degenerative medial meniscus tear given he has more pain with seated to standing which slightly improves with walking.  At this point, I recommended bilateral knee cortisone injections for which she would like to proceed.  He will follow-up with [place] as needed.  Call with concerns or questions in the meantime.  Follow-Up Instructions: Return if symptoms worsen or fail to improve.   Orders:  Orders Placed This Encounter  Procedures   Large Joint Inj: bilateral knee   XR KNEE 3 VIEW LEFT   XR KNEE 3 VIEW RIGHT   No orders of the defined types were placed in this encounter.     Procedures: Large Joint Inj: bilateral knee on 03/17/2021 10:27 AM Indications: pain Details: 22 G needle, anterolateral approach Medications (Right): 0.66 mL bupivacaine 0.25 %; 3 mL lidocaine 1 %; 13.33 mg methylPREDNISolone acetate 40 MG/ML Medications (Left): 0.66 mL bupivacaine 0.25 %; 3 mL lidocaine 1 %; 13.33 mg methylPREDNISolone acetate 40 MG/ML     Clinical Data: No additional findings.   Subjective: Chief Complaint  Patient presents with   Right Knee - Pain   Left Knee - Pain    HPI patient is a pleasant 50 year old gentleman who comes in today with bilateral knee pain both equally as bad.  He has had pain to both knees for over a decade.  No specific injury, but he does note minor injuries to both knees while in the [employer].  The pain has progressively worsened and  has recently started to affect his daily activities.  The pain is primarily to the medial aspect of both knees.  He does note remote mechanical symptoms but has recently not noticed this.  He notes occasional instability.  Pain seems to be worse with squatting and stair climbing as well as going from a seated to standing position and with increased activity.  He has been taking meloxicam and ibuprofen without significant relief.  He has not previously undergone cortisone injection to either knee.  Review of Systems as detailed in HPI.  All others reviewed and are negative.   Objective: Vital Signs: There were no vitals taken for this visit.  Physical Exam well-developed well-nourished gentleman in no acute distress.  Alert and oriented x3.  Ortho Exam bilateral knee exams show no effusion.  Range of motion 0 to 125 degrees.  Medial joint line tenderness both sides.  Mild patellofemoral crepitus.  He is stable valgus varus stress.  Negative anterior drawer test.  He is neurovascular intact distally.  Specialty Comments:  No specialty comments available.  Imaging: XR KNEE 3 VIEW LEFT  Result Date: 03/17/2021 X-rays show mild tricompartmental joint space narrowing  XR KNEE 3 VIEW RIGHT  Result Date: 03/17/2021 X-rays show mild tricompartmental joint space narrowing with patella osteophytes    PMFS History:  Patient Active Problem List   Diagnosis Date Noted   Plantar fasciitis, left 07/13/2020   Chronic pain of left elbow 07/13/2020   Acute otitis externa of right ear 03/04/2020   Chronic pain of both knees 07/14/2016   Left wrist pain 07/14/2016   Allergic rhinitis 12/18/2013   IBS (irritable bowel syndrome)- diarrhea predminant 08/11/2013   Hemorrhoids, internal, with bleeding and Grade 2 prolapse 08/11/2013   Hypertriglyceridemia 04/05/2012   Past Medical History:  Diagnosis Date   Colon polyp    History of eczema    IBS (irritable bowel syndrome)     Family History  Problem  Relation Age of Onset   Hypertension Father    Hyperlipidemia Father    Breast cancer Mother    Cancer Paternal Grandfather        ?   Heart attack Paternal Grandfather 79    Past Surgical History:  Procedure Laterality Date   COLONOSCOPY W/ BIOPSIES     HEMORRHOID BANDING  2014   multiple   MINOR HEMORRHOIDECTOMY     Thrombosed hemorrhoid treatment   PILONIDAL CYST EXCISION  2007   VASECTOMY     Social History   Occupational History   Occupation: Estate manager/land agent office    Employer: [employer]  Tobacco Use   Smoking status: Never   Smokeless tobacco: Former  Substance and Sexual Activity   Alcohol use: Yes    Comment: Occasional   Drug use: No   Sexual activity: Yes    Birth control/protection: None

## 2021-04-17 ENCOUNTER — Other Ambulatory Visit: Payer: Self-pay | Admitting: Family Medicine

## 2021-04-17 NOTE — Telephone Encounter (Signed)
Last office visit 03/11/21 knee pain.  Last refilled 01/06/21 for #30 with 3 refills.  CPE scheduled for 08/09/21.

## 2021-08-02 ENCOUNTER — Other Ambulatory Visit: Payer: 59

## 2021-08-09 ENCOUNTER — Encounter: Payer: 59 | Admitting: Family Medicine

## 2021-08-16 ENCOUNTER — Other Ambulatory Visit: Payer: 59

## 2021-08-19 ENCOUNTER — Encounter: Payer: 59 | Admitting: Family Medicine

## 2021-08-30 ENCOUNTER — Encounter: Payer: 59 | Admitting: Family Medicine

## 2021-09-05 ENCOUNTER — Other Ambulatory Visit: Payer: Self-pay | Admitting: Family Medicine

## 2021-09-05 NOTE — Telephone Encounter (Signed)
Last office visit 03/11/2021 for knee pain.  Last refilled 04/18/2021 for #30 with 3 refills.  CPE scheduled 09/20/2021.

## 2021-09-20 ENCOUNTER — Other Ambulatory Visit: Payer: Self-pay

## 2021-09-20 ENCOUNTER — Ambulatory Visit (INDEPENDENT_AMBULATORY_CARE_PROVIDER_SITE_OTHER): Payer: 59 | Admitting: Family Medicine

## 2021-09-20 ENCOUNTER — Encounter: Payer: Self-pay | Admitting: Family Medicine

## 2021-09-20 VITALS — BP 120/80 | HR 74 | Temp 98.0°F | Ht 71.0 in | Wt 201.2 lb

## 2021-09-20 DIAGNOSIS — Z1159 Encounter for screening for other viral diseases: Secondary | ICD-10-CM | POA: Diagnosis not present

## 2021-09-20 DIAGNOSIS — Z0000 Encounter for general adult medical examination without abnormal findings: Secondary | ICD-10-CM | POA: Diagnosis not present

## 2021-09-20 DIAGNOSIS — E781 Pure hyperglyceridemia: Secondary | ICD-10-CM | POA: Diagnosis not present

## 2021-09-20 DIAGNOSIS — Z125 Encounter for screening for malignant neoplasm of prostate: Secondary | ICD-10-CM

## 2021-09-20 LAB — LIPID PANEL
Cholesterol: 244 mg/dL — ABNORMAL HIGH (ref 0–200)
HDL: 48.5 mg/dL (ref >39.00)
NonHDL: 195.12
Total CHOL/HDL Ratio: 5
Triglycerides: 243 mg/dL — ABNORMAL HIGH (ref 0.0–149.0)
VLDL: 48.6 mg/dL — ABNORMAL HIGH (ref 0.0–40.0)

## 2021-09-20 LAB — COMPREHENSIVE METABOLIC PANEL
ALT: 37 U/L (ref 0–53)
AST: 23 U/L (ref 0–37)
Albumin: 4.3 g/dL (ref 3.5–5.2)
Alkaline Phosphatase: 68 U/L (ref 39–117)
BUN: 21 mg/dL (ref 6–23)
CO2: 31 mEq/L (ref 19–32)
Calcium: 9.2 mg/dL (ref 8.4–10.5)
Chloride: 104 mEq/L (ref 96–112)
Creatinine, Ser: 0.91 mg/dL (ref 0.40–1.50)
GFR: 99.28 mL/min (ref >60.00)
Glucose, Bld: 96 mg/dL (ref 70–99)
Potassium: 4.4 mEq/L (ref 3.5–5.1)
Sodium: 141 mEq/L (ref 135–145)
Total Bilirubin: 0.5 mg/dL (ref 0.2–1.2)
Total Protein: 7 g/dL (ref 6.0–8.3)

## 2021-09-20 LAB — LDL CHOLESTEROL, DIRECT: Direct LDL: 127 mg/dL

## 2021-09-20 LAB — PSA: PSA: 0.49 ng/mL (ref 0.10–4.00)

## 2021-09-20 NOTE — Patient Instructions (Addendum)
Please stop at the lab to have labs drawn.  Start low impact exercise.  Can start glucosamine 500 mg 1-3 times daily.

## 2021-09-20 NOTE — Progress Notes (Signed)
Patient ID: Raymond Lloyd, male    DOB: 1971-07-07, 50 y.o.   MRN: [id number]  This visit was conducted in person.  BP 120/80    Pulse 74    Temp 98 F (36.7 C) (Temporal)    Ht 5\' 11"  (1.803 m)    Wt 201 lb 4 oz (91.3 kg)    SpO2 97%    BMI 28.07 kg/m    CC:  Chief Complaint  Patient presents with   Annual Exam    Subjective:   HPI: Raymond Lloyd is a 50 y.o. male presenting on 09/20/2021 for Annual Exam   He is doing well overall.   Having chronic  knee and hip pain.. using meloxicam prn.  Due for re-eval with labs.  Diet: varied, moderate  Exercise:  minimal given joint pain.   Relevant past medical, surgical, family and social history reviewed and updated as indicated. Interim medical history since our last visit reviewed. Allergies and medications reviewed and updated. Outpatient Medications Prior to Visit  Medication Sig Dispense Refill   Clocortolone Pivalate (CLODERM) 0.1 % cream      fexofenadine (ALLEGRA) 180 MG tablet Take 180 mg by mouth daily.     fluticasone (CUTIVATE) 0.005 % ointment Apply topically 2 (two) times daily as needed.      fluticasone (FLONASE) 50 MCG/ACT nasal spray SPRAY 2 SPRAYS INTO EACH NOSTRIL EVERY DAY 16 mL 2   loperamide (IMODIUM) 2 MG capsule Take 2 mg by mouth 2 (two) times daily as needed.      meloxicam (MOBIC) 15 MG tablet TAKE 1 TABLET BY MOUTH EVERY DAY 30 tablet 3   Omega-3 Fatty Acids (FISH OIL PO) Take 1200 by mouth, two in am and one pm     No facility-administered medications prior to visit.     Per HPI unless specifically indicated in ROS section below Review of Systems  Constitutional:  Negative for fatigue and fever.  HENT:  Negative for ear pain.   Eyes:  Negative for pain.  Respiratory:  Negative for cough and shortness of breath.   Cardiovascular:  Negative for chest pain, palpitations and leg swelling.  Gastrointestinal:  Negative for abdominal pain.  Genitourinary:  Negative for dysuria.  Musculoskeletal:   Negative for arthralgias.  Neurological:  Negative for syncope, light-headedness and headaches.  Psychiatric/Behavioral:  Negative for dysphoric mood.   Objective:  BP 120/80    Pulse 74    Temp 98 F (36.7 C) (Temporal)    Ht 5\' 11"  (1.803 m)    Wt 201 lb 4 oz (91.3 kg)    SpO2 97%    BMI 28.07 kg/m   Wt Readings from Last 3 Encounters:  09/20/21 201 lb 4 oz (91.3 kg)  03/11/21 207 lb (93.9 kg)  08/05/20 206 lb 12 oz (93.8 kg)      Physical Exam Constitutional:      General: He is not in acute distress.    Appearance: Normal appearance. He is well-developed. He is not ill-appearing or toxic-appearing.  HENT:     Head: Normocephalic and atraumatic.     Right Ear: Hearing, tympanic membrane, ear canal and external ear normal.     Left Ear: Hearing, tympanic membrane, ear canal and external ear normal.     Nose: Nose normal.     Mouth/Throat:     Pharynx: Uvula midline.  Eyes:     General: Lids are normal. Lids are everted, no foreign bodies appreciated.  Conjunctiva/sclera: Conjunctivae normal.     Pupils: Pupils are equal, round, and reactive to light.  Neck:     Thyroid: No thyroid mass or thyromegaly.     Vascular: No carotid bruit.     Trachea: Trachea and phonation normal.  Cardiovascular:     Rate and Rhythm: Normal rate and regular rhythm.     Pulses: Normal pulses.     Heart sounds: S1 normal and S2 normal. No murmur heard.   No gallop.  Pulmonary:     Breath sounds: Normal breath sounds. No wheezing, rhonchi or rales.  Abdominal:     General: Bowel sounds are normal.     Palpations: Abdomen is soft.     Tenderness: There is no abdominal tenderness. There is no guarding or rebound.     Hernia: No hernia is present.  Musculoskeletal:     Cervical back: Normal range of motion and neck supple.  Lymphadenopathy:     Cervical: No cervical adenopathy.  Skin:    General: Skin is warm and dry.     Findings: No rash.  Neurological:     Mental Status: He is alert.      Cranial Nerves: No cranial nerve deficit.     Sensory: No sensory deficit.     Gait: Gait normal.     Deep Tendon Reflexes: Reflexes are normal and symmetric.  Psychiatric:        Speech: Speech normal.        Behavior: Behavior normal.        Judgment: Judgment normal.      Results for orders placed or performed in visit on 07/29/20  Comprehensive metabolic panel  Result Value Ref Range   Sodium 141 135 - 145 mEq/L   Potassium 4.6 3.5 - 5.1 mEq/L   Chloride 104 96 - 112 mEq/L   CO2 30 19 - 32 mEq/L   Glucose, Bld 97 70 - 99 mg/dL   BUN 18 6 - 23 mg/dL   Creatinine, Ser 1.07 0.40 - 1.50 mg/dL   Total Bilirubin 0.5 0.2 - 1.2 mg/dL   Alkaline Phosphatase 59 39 - 117 U/L   AST 22 0 - 37 U/L   ALT 25 0 - 53 U/L   Total Protein 6.4 6.0 - 8.3 g/dL   Albumin 4.2 3.5 - 5.2 g/dL   GFR 86.00 >60.00 mL/min   Calcium 8.9 8.4 - 10.5 mg/dL  Lipid panel  Result Value Ref Range   Cholesterol 229 (H) 0 - 200 mg/dL   Triglycerides 243.0 (H) 0.0 - 149.0 mg/dL   HDL 43.60 >39.00 mg/dL   VLDL 48.6 (H) 0.0 - 40.0 mg/dL   Total CHOL/HDL Ratio 5    NonHDL 185.85   LDL cholesterol, direct  Result Value Ref Range   Direct LDL 114.0 mg/dL    This visit occurred during the SARS-CoV-2 public health emergency.  Safety protocols were in place, including screening questions prior to the visit, additional usage of staff PPE, and extensive cleaning of exam room while observing appropriate contact time as indicated for disinfecting solutions.   COVID 19 screen:  No recent travel or known exposure to COVID19 The patient denies respiratory symptoms of COVID 19 at this time. The importance of social distancing was discussed today.   Assessment and Plan The patient's preventative maintenance and recommended screening tests for an annual wellness exam were reviewed in full today. Brought up to date unless services declined.  Counselled on the importance of diet, exercise,  and its role in overall  health and mortality. The patient's FH and SH was reviewed, including their home life, tobacco status, and drug and alcohol status.    Nonsmoker.   Tdap  and flu vaccine uptodate  Discussed COVID19 vaccine side effects and benefits. Strongly encouraged the patient to get the vaccine. Questions answered. No prostate cancer  family history Nml colonoscopy 2015, Dr. Carlean Purl. Recall in 2023 at age 24  STD screen:  Refused.  Hep C:  due   Problem List Items Addressed This Visit     Hypertriglyceridemia   Relevant Orders   Lipid panel   Comprehensive metabolic panel   Other Visit Diagnoses     Routine general medical examination at a health care facility    -  Primary   Screening for prostate cancer       Relevant Orders   PSA   Need for hepatitis C screening test       Relevant Orders   Hepatitis C antibody       Eliezer Lofts, MD

## 2021-09-21 LAB — HEPATITIS C ANTIBODY
Hepatitis C Ab: NONREACTIVE
SIGNAL TO CUT-OFF: 0.04 (ref <1.00)

## 2021-11-20 ENCOUNTER — Encounter: Payer: Self-pay | Admitting: Family Medicine

## 2022-01-02 ENCOUNTER — Other Ambulatory Visit: Payer: Self-pay | Admitting: Family Medicine

## 2022-01-02 NOTE — Telephone Encounter (Signed)
Last office visit 09/20/2021 for CPE.  Last refilled 09/05/2021 for #30 with 3 refills.  CPE scheduled for 09/21/2022. ?

## 2022-02-14 ENCOUNTER — Ambulatory Visit: Payer: 59 | Admitting: Orthopaedic Surgery

## 2022-02-14 ENCOUNTER — Encounter: Payer: Self-pay | Admitting: Orthopaedic Surgery

## 2022-02-14 ENCOUNTER — Ambulatory Visit (INDEPENDENT_AMBULATORY_CARE_PROVIDER_SITE_OTHER): Payer: 59

## 2022-02-14 DIAGNOSIS — M1612 Unilateral primary osteoarthritis, left hip: Secondary | ICD-10-CM | POA: Diagnosis not present

## 2022-02-14 DIAGNOSIS — M1611 Unilateral primary osteoarthritis, right hip: Secondary | ICD-10-CM

## 2022-02-14 DIAGNOSIS — M1711 Unilateral primary osteoarthritis, right knee: Secondary | ICD-10-CM | POA: Insufficient documentation

## 2022-02-14 DIAGNOSIS — M1712 Unilateral primary osteoarthritis, left knee: Secondary | ICD-10-CM

## 2022-02-14 NOTE — Progress Notes (Signed)
? ?Office Visit Note ?  ?Patient: Raymond Lloyd           ?Date of Birth: 01-31-1971           ?MRN: [id number] ?Visit Date: 02/14/2022 ?             ?Requested by: Jinny Sanders, MD ?[address] ?[place],  [place] [address] ?PCP: Jinny Sanders, MD ? ? ?Assessment & Plan: ?Visit Diagnoses:  ?1. Primary osteoarthritis of right hip   ?2. Primary osteoarthritis of left hip   ?3. Primary osteoarthritis of right knee   ?4. Primary osteoarthritis of left knee   ? ? ?Plan: Impression is moderate bilateral hip DJD.  X-rays are showing spurring and femoral head deformity although joint spaces still relatively well-preserved.  Treatment options were reviewed in detail and activity recommendations were made.  We will see him back as needed. ? ?Follow-Up Instructions: No follow-ups on file.  ? ?Orders:  ?Orders Placed This Encounter  ?Procedures  ? XR HIPS BILAT W OR W/O PELVIS 3-4 VIEWS  ? ?No orders of the defined types were placed in this encounter. ? ? ? ? Procedures: ?No procedures performed ? ? ?Clinical Data: ?No additional findings. ? ? ?Subjective: ?Chief Complaint  ?Patient presents with  ? Right Knee - Pain  ? Left Knee - Pain  ? Right Hip - Pain  ? Left Hip - Pain  ? ? ?HPI ? 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  He reports start up stiffness and pain.  Has had to change some activities such as dirt bike and mountain biking and running as a result.  Meloxicam does help.  Mainly wants to get an idea of how much arthritis he has.  Currently he is able to do daily activities without significant discomfort.  He is thinking about early retirement from the police force. ? ?Review of Systems  ?Constitutional: Negative.   ?All other systems reviewed and are negative. ? ? ?Objective: ?Vital Signs: There were no vitals taken for this visit. ? ?Physical Exam ?Vitals and nursing note reviewed.  ?Constitutional:   ?    Appearance: He is well-developed.  ?Pulmonary:  ?   Effort: Pulmonary effort is normal.  ?Abdominal:  ?   Palpations: Abdomen is soft.  ?Skin: ?   General: Skin is warm.  ?Neurological:  ?   Mental Status: He is alert and oriented to person, place, and time.  ?Psychiatric:     ?   Behavior: Behavior normal.     ?   Thought Content: Thought content normal.     ?   Judgment: Judgment normal.  ? ? ?Ortho Exam ? ?Examination of bilateral hips show relatively well-preserved range of motion.  Slight limitation in internal rotation.  No sciatic tension signs.  No lateral hip tenderness. ? ?Specialty Comments:  ?No specialty comments available. ? ?Imaging: ?XR HIPS BILAT W OR W/O PELVIS 3-4 VIEWS ? ?Result Date: 02/14/2022 ?Moderate bilateral hip DJD.  ? ? ?PMFS History: ?Patient Active Problem List  ? Diagnosis Date Noted  ? Primary osteoarthritis of right hip 02/14/2022  ? Primary osteoarthritis of left hip 02/14/2022  ? Primary osteoarthritis of right knee 02/14/2022  ? Primary osteoarthritis of left knee 02/14/2022  ? Plantar fasciitis, left 07/13/2020  ? Chronic pain of left elbow 07/13/2020  ? Acute otitis externa of right ear 03/04/2020  ?  Chronic pain of both knees 07/14/2016  ? Left wrist pain 07/14/2016  ? Allergic rhinitis 12/18/2013  ? IBS (irritable bowel syndrome)- diarrhea predminant 08/11/2013  ? Hemorrhoids, internal, with bleeding and Grade 2 prolapse 08/11/2013  ? Hypertriglyceridemia 04/05/2012  ? ?Past Medical History:  ?Diagnosis Date  ? Colon polyp   ? History of eczema   ? IBS (irritable bowel syndrome)   ?  ?Family History  ?Problem Relation Age of Onset  ? Hypertension Father   ? Hyperlipidemia Father   ? Breast cancer Mother   ? Cancer Paternal Grandfather   ?     ?  ? Heart attack Paternal Grandfather 44  ?  ?Past Surgical History:  ?Procedure Laterality Date  ? COLONOSCOPY W/ BIOPSIES    ? HEMORRHOID BANDING  2014  ? multiple  ? MINOR HEMORRHOIDECTOMY    ? Thrombosed hemorrhoid treatment  ?  PILONIDAL CYST EXCISION  2007  ? VASECTOMY    ? ?Social History  ? ?Occupational History  ? Occupation: Sheriff's office  ?  Employer: [employer]  ?Tobacco Use  ? Smoking status: Never  ? Smokeless tobacco: Former  ?Substance and Sexual Activity  ? Alcohol use: Yes  ?  Comment: Occasional  ? Drug use: No  ? Sexual activity: Yes  ?  Birth control/protection: None  ? ? ? ? ? ? ?

## 2022-02-22 ENCOUNTER — Encounter: Payer: Self-pay | Admitting: Family Medicine

## 2022-05-04 ENCOUNTER — Other Ambulatory Visit: Payer: Self-pay | Admitting: Family Medicine

## 2022-05-04 NOTE — Telephone Encounter (Signed)
Last office visit 09/20/2021 for CPE.  Last refilled 01/03/22 for #30 with 3 refills.  CPE schedule 09/22/22.

## 2022-06-26 ENCOUNTER — Encounter: Payer: Self-pay | Admitting: Internal Medicine

## 2022-07-12 ENCOUNTER — Ambulatory Visit (AMBULATORY_SURGERY_CENTER): Payer: Self-pay

## 2022-07-12 VITALS — Ht 71.0 in | Wt 191.0 lb

## 2022-07-12 DIAGNOSIS — Z1211 Encounter for screening for malignant neoplasm of colon: Secondary | ICD-10-CM

## 2022-07-12 NOTE — Progress Notes (Signed)
No egg or soy allergy known to patient  No issues known to pt with past sedation with any surgeries or procedures Patient denies ever being told they had issues or difficulty with intubation  No FH of Malignant Hyperthermia Pt is not on diet pills Pt is not on  home 02  Pt is not on blood thinners  Pt denies issues with constipation  No A fib or A flutter Have any cardiac testing pending--denied

## 2022-08-08 ENCOUNTER — Encounter: Payer: Self-pay | Admitting: Internal Medicine

## 2022-08-14 ENCOUNTER — Telehealth: Payer: Self-pay | Admitting: Internal Medicine

## 2022-08-14 NOTE — Telephone Encounter (Signed)
Call to pt to let him know Dr Carlean Purl states ok to proceed as planned with colon on 11/8, pt verb understanding.

## 2022-08-14 NOTE — Telephone Encounter (Signed)
Patient called states he just stopped his Mobic medication yesterday, wondering if that is ok since his procedure is for 08/16/22.

## 2022-08-14 NOTE — Telephone Encounter (Signed)
Not a problem - proceed as planned

## 2022-08-16 ENCOUNTER — Encounter: Payer: Self-pay | Admitting: Internal Medicine

## 2022-08-16 ENCOUNTER — Ambulatory Visit (AMBULATORY_SURGERY_CENTER): Payer: 59 | Admitting: Internal Medicine

## 2022-08-16 VITALS — BP 136/91 | HR 55 | Temp 98.4°F | Resp 12 | Ht 71.0 in | Wt 191.0 lb

## 2022-08-16 DIAGNOSIS — Z1211 Encounter for screening for malignant neoplasm of colon: Secondary | ICD-10-CM | POA: Diagnosis present

## 2022-08-16 MED ORDER — SODIUM CHLORIDE 0.9 % IV SOLN: 500.0000 mL | Freq: Once | INTRAVENOUS | Status: DC

## 2022-08-16 NOTE — Op Note (Signed)
Raymond Lloyd: Raymond Lloyd Procedure Date: 08/16/2022 11:03 AM MRN: [id number] Endoscopist: Gatha Mayer , MD, [id number] Age: 51 Referring MD:  Date of Birth: 21-Apr-1971 Gender: Male Account #: [id number] Procedure:                Colonoscopy Indications:              Screening for colorectal malignant neoplasm Medicines:                Monitored Anesthesia Care Procedure:                Pre-Anesthesia Assessment:                           - Prior to the procedure, a History and Physical                            was performed, and patient medications and                            allergies were reviewed. The patient's tolerance of                            previous anesthesia was also reviewed. The risks                            and benefits of the procedure and the sedation                            options and risks were discussed with the patient.                            All questions were answered, and informed consent                            was obtained. Prior Anticoagulants: The patient has                            taken no anticoagulant or antiplatelet agents. ASA                            Grade Assessment: II - A patient with mild systemic                            disease. After reviewing the risks and benefits,                            the patient was deemed in satisfactory condition to                            undergo the procedure.                           After obtaining informed consent, the colonoscope  was passed under direct vision. Throughout the                            procedure, the patient's blood pressure, pulse, and                            oxygen saturations were monitored continuously. The                            CF [id number] #[id number] was introduced through the anus                            and advanced to the the terminal ileum, with                            identification of  the appendiceal orifice and IC                            valve. The colonoscopy was performed without                            difficulty. The patient tolerated the procedure                            well. The quality of the bowel preparation was                            good. The bowel preparation used was Miralax via                            split dose instruction. Scope In: 11:08:37 AM Scope Out: 11:20:46 AM Scope Withdrawal Time: 0 hours 9 minutes 37 seconds  Total Procedure Duration: 0 hours 12 minutes 9 seconds  Findings:                 The perianal and digital rectal examinations were                            normal. Pertinent negatives include normal prostate                            (size, shape, and consistency).                           Multiple small-mouthed diverticula were found in                            the sigmoid colon.                           The terminal ileum appeared normal.                           The exam was otherwise without abnormality on  direct and retroflexion views. Complications:            No immediate complications. Estimated Blood Loss:     Estimated blood loss: none. Impression:               - Diverticulosis in the sigmoid colon.                           - The examined portion of the ileum was normal.                           - The examination was otherwise normal on direct                            and retroflexion views.                           - No specimens collected. Recommendation:           - Patient has a contact number available for                            emergencies. The signs and symptoms of potential                            delayed complications were discussed with the                            patient. Return to normal activities tomorrow.                            Written discharge instructions were provided to the                            patient.                            - Resume previous diet.                           - Continue present medications.                           - Repeat colonoscopy in 10 years for screening                            purposes. Gatha Mayer, MD 08/16/2022 11:29:10 AM This report has been signed electronically.

## 2022-08-16 NOTE — Patient Instructions (Addendum)
Please read handouts provided. Continue present medications.   YOU HAD AN ENDOSCOPIC PROCEDURE TODAY AT [facility]:   Refer to the procedure report that was given to you for any specific questions about what was found during the examination.  If the procedure report does not answer your questions, please call your gastroenterologist to clarify.  If you requested that your care partner not be given the details of your procedure findings, then the procedure report has been included in a sealed envelope for you to review at your convenience later.  YOU SHOULD EXPECT: Some feelings of bloating in the abdomen. Passage of more gas than usual.  Walking can help get rid of the air that was put into your GI tract during the procedure and reduce the bloating. If you had a lower endoscopy (such as a colonoscopy or flexible sigmoidoscopy) you may notice spotting of blood in your stool or on the toilet paper. If you underwent a bowel prep for your procedure, you may not have a normal bowel movement for a few days.  Please Note:  You might notice some irritation and congestion in your nose or some drainage.  This is from the oxygen used during your procedure.  There is no need for concern and it should clear up in a day or so.  SYMPTOMS TO REPORT IMMEDIATELY:  Following lower endoscopy (colonoscopy or flexible sigmoidoscopy):  Excessive amounts of blood in the stool  Significant tenderness or worsening of abdominal pains  Swelling of the abdomen that is new, acute  Fever of 100F or higher.  For urgent or emergent issues, a gastroenterologist can be reached at any hour by calling [phone]. Do not use MyChart messaging for urgent concerns.    DIET:  We do recommend a small meal at first, but then you may proceed to your regular diet.  Drink plenty of fluids but you should avoid alcoholic beverages for 24 hours.  ACTIVITY:  You should plan to take it easy for the rest of today and you  should NOT DRIVE or use heavy machinery until tomorrow (because of the sedation medicines used during the test).    FOLLOW UP: Our staff will call the number listed on your records the next business day following your procedure.  We will call around 7:15- 8:00 am to check on you and address any questions or concerns that you may have regarding the information given to you following your procedure. If we do not reach you, we will leave a message.     If any biopsies were taken you will be contacted by phone or by letter within the next 1-3 weeks.  Please call us at [phone] if you have not heard about the biopsies in 3 weeks.    SIGNATURES/CONFIDENTIALITY: You and/or your care partner have signed paperwork which will be entered into your electronic medical record.  These signatures attest to the fact that that the information above on your After Visit Summary has been reviewed and is understood.  Full responsibility of the confidentiality of this discharge information lies with you and/or your care-partner.No polyps or cancer were seen.  You do have diverticulosis - thickened muscle rings and pouches in the colon wall. Please read the handout about this condition.  Next routine colonoscopy or other screening test in 10 years - 2033.  Good luck with your upcoming move and new job!  I appreciate the opportunity to care for you. Gatha Mayer, MD, [facility]

## 2022-08-16 NOTE — Progress Notes (Signed)
Sedate, gd SR, tolerated procedure well, VSS, report to RN 

## 2022-08-16 NOTE — Progress Notes (Signed)
VS completed by DT.  Pt's states no medical or surgical changes since previsit or office visit.  

## 2022-08-16 NOTE — Progress Notes (Signed)
[facility] History and Physical   Primary Care Physician:  Jinny Sanders, MD   Reason for Procedure:   CRCA screening  Plan:    colonoscopy     HPI: Raymond Lloyd is a 51 y.o. male here for screening exam   Past Medical History:  Diagnosis Date   Colon polyp    History of eczema    IBS (irritable bowel syndrome)     Past Surgical History:  Procedure Laterality Date   COLONOSCOPY     COLONOSCOPY W/ BIOPSIES     HEMORRHOID BANDING  10/09/2012   multiple   MINOR HEMORRHOIDECTOMY     Thrombosed hemorrhoid treatment   PILONIDAL CYST EXCISION  10/09/2005   VASECTOMY      Prior to Admission medications   Medication Sig Start Date End Date Taking? Authorizing Provider  Clocortolone Pivalate (CLODERM) 0.1 % cream  12/08/19  Yes [provider]  fexofenadine (ALLEGRA) 180 MG tablet Take 180 mg by mouth daily.   Yes [provider]  fluticasone (FLONASE) 50 MCG/ACT nasal spray SPRAY 2 SPRAYS INTO EACH NOSTRIL EVERY DAY 09/26/19  Yes Copland, Frederico Hamman, MD  meloxicam (MOBIC) 15 MG tablet TAKE 1 TABLET BY MOUTH EVERY DAY 05/04/22  Yes Bedsole, Amy E, MD  Omega-3 Fatty Acids (FISH OIL PO) Take 1200 by mouth, two in am and one pm   Yes [provider]  fluticasone (CUTIVATE) 0.005 % ointment Apply topically 2 (two) times daily as needed.     [provider]  loperamide (IMODIUM) 2 MG capsule Take 2 mg by mouth 2 (two) times daily as needed.  Patient not taking: Reported on 07/12/2022    [provider]    Current Outpatient Medications  Medication Sig Dispense Refill   Clocortolone Pivalate (CLODERM) 0.1 % cream      fexofenadine (ALLEGRA) 180 MG tablet Take 180 mg by mouth daily.     fluticasone (FLONASE) 50 MCG/ACT nasal spray SPRAY 2 SPRAYS INTO EACH NOSTRIL EVERY DAY 16 mL 2   meloxicam (MOBIC) 15 MG tablet TAKE 1 TABLET BY MOUTH EVERY DAY 30 tablet 3   Omega-3 Fatty Acids (FISH OIL PO) Take 1200 by mouth, two in am and  one pm     fluticasone (CUTIVATE) 0.005 % ointment Apply topically 2 (two) times daily as needed.      loperamide (IMODIUM) 2 MG capsule Take 2 mg by mouth 2 (two) times daily as needed.  (Patient not taking: Reported on 07/12/2022)     Current Facility-Administered Medications  Medication Dose Route Frequency Provider Last Rate Last Admin   0.9 %  sodium chloride infusion  500 mL Intravenous Once Gatha Mayer, MD        Allergies as of 08/16/2022   (No Known Allergies)    Family History  Problem Relation Age of Onset   Breast cancer Mother    Hypertension Father    Hyperlipidemia Father    Cancer Paternal Grandfather        ?   Heart attack Paternal Grandfather 20   Colon cancer Neg Hx    Colon polyps Neg Hx    Esophageal cancer Neg Hx    Stomach cancer Neg Hx    Rectal cancer Neg Hx     Social History   Socioeconomic History   Marital status: Married    Spouse name: Not on file   Number of children: 2   Years of education: Not on file   Highest  education level: Not on file  Occupational History   Occupation: Sheriff's office    Employer: [employer]  Tobacco Use   Smoking status: Never   Smokeless tobacco: Former    Types: Nurse, children's Use: Never used  Substance and Sexual Activity   Alcohol use: Yes    Comment: Occasional   Drug use: No   Sexual activity: Yes    Birth control/protection: None  Other Topics Concern   Not on file  Social History Narrative   Occupation: Works for [employer] office, 12 hour shifts   Previously in [employer], [place]   Married   2 children-sons, healthy   Never Smoked   Alcohol use-yes, 0-6 per week   Drug use-no   Regular exercise-yes, 3-5 days per week   Diet: fast food, water            Social Determinants of Health   Financial Resource Strain: Not on file  Food Insecurity: Not on file  Transportation Needs: Not on file  Physical Activity: Not on file  Stress: Not on file   Social Connections: Not on file  Intimate Partner Violence: Not on file    Review of Systems:  All other review of systems negative except as mentioned in the HPI.  Physical Exam: Vital signs BP 115/71   Pulse (!) 57   Temp 98.4 F (36.9 C) (Temporal)   Ht '5\' 11"'$  (1.803 m)   Wt 191 lb (86.6 kg)   SpO2 100%   BMI 26.64 kg/m   General:   Alert,  Well-developed, well-nourished, pleasant and cooperative in NAD Lungs:  Clear throughout to auscultation.   Heart:  Regular rate and rhythm; no murmurs, clicks, rubs,  or gallops. Abdomen:  Soft, nontender and nondistended. Normal bowel sounds.   Neuro/Psych:  Alert and cooperative. Normal mood and affect. A and O x 3   '@[facility]'$  Simonne Maffucci, MD, [facility] [phone] (pager) 08/16/2022 10:57 AM@

## 2022-08-17 ENCOUNTER — Telehealth: Payer: Self-pay

## 2022-08-17 NOTE — Telephone Encounter (Signed)
  Follow up Call-     08/16/2022    9:41 AM  Call back number  Post procedure Call Back phone  # [phone]  Permission to leave phone message Yes     Patient questions:  Do you have a fever, pain , or abdominal swelling? No. Pain Score  0 *  Have you tolerated food without any problems? Yes.    Have you been able to return to your normal activities? Yes.    Do you have any questions about your discharge instructions: Diet   No. Medications  No. Follow up visit  No.  Do you have questions or concerns about your Care? No.  Actions: * If pain score is 4 or above: No action needed, pain <4.

## 2022-08-29 ENCOUNTER — Other Ambulatory Visit: Payer: Self-pay | Admitting: Family Medicine

## 2022-08-29 NOTE — Telephone Encounter (Signed)
Last office visit 09/20/2021 for CPE.  Last refilled 05/04/22 for #30 with 3 refills.  CPE scheduled 09/22/22.

## 2022-09-21 ENCOUNTER — Encounter: Payer: 59 | Admitting: Family Medicine

## 2022-09-22 ENCOUNTER — Ambulatory Visit (INDEPENDENT_AMBULATORY_CARE_PROVIDER_SITE_OTHER): Payer: 59 | Admitting: Family Medicine

## 2022-09-22 ENCOUNTER — Encounter: Payer: Self-pay | Admitting: Family Medicine

## 2022-09-22 VITALS — BP 126/80 | HR 74 | Temp 98.5°F | Ht 71.0 in | Wt 190.1 lb

## 2022-09-22 DIAGNOSIS — E781 Pure hyperglyceridemia: Secondary | ICD-10-CM

## 2022-09-22 DIAGNOSIS — Z125 Encounter for screening for malignant neoplasm of prostate: Secondary | ICD-10-CM

## 2022-09-22 DIAGNOSIS — Z0000 Encounter for general adult medical examination without abnormal findings: Secondary | ICD-10-CM | POA: Diagnosis not present

## 2022-09-22 LAB — LIPID PANEL
Cholesterol: 238 mg/dL — ABNORMAL HIGH (ref 0–200)
HDL: 51.4 mg/dL (ref >39.00)
LDL Cholesterol: 157 mg/dL — ABNORMAL HIGH (ref 0–99)
NonHDL: 186.19
Total CHOL/HDL Ratio: 5
Triglycerides: 144 mg/dL (ref 0.0–149.0)
VLDL: 28.8 mg/dL (ref 0.0–40.0)

## 2022-09-22 LAB — COMPREHENSIVE METABOLIC PANEL
ALT: 23 U/L (ref 0–53)
AST: 19 U/L (ref 0–37)
Albumin: 4.4 g/dL (ref 3.5–5.2)
Alkaline Phosphatase: 69 U/L (ref 39–117)
BUN: 23 mg/dL (ref 6–23)
CO2: 28 mEq/L (ref 19–32)
Calcium: 9 mg/dL (ref 8.4–10.5)
Chloride: 103 mEq/L (ref 96–112)
Creatinine, Ser: 1 mg/dL (ref 0.40–1.50)
GFR: 88.03 mL/min (ref >60.00)
Glucose, Bld: 96 mg/dL (ref 70–99)
Potassium: 4.2 mEq/L (ref 3.5–5.1)
Sodium: 139 mEq/L (ref 135–145)
Total Bilirubin: 0.6 mg/dL (ref 0.2–1.2)
Total Protein: 6.9 g/dL (ref 6.0–8.3)

## 2022-09-22 LAB — PSA: PSA: 0.6 ng/mL (ref 0.10–4.00)

## 2022-09-22 NOTE — Progress Notes (Signed)
Patient ID: Raymond RODELO, male    DOB: 1971/01/25, 51 y.o.   MRN: [id number]  This visit was conducted in person.  BP 126/80   Pulse 74   Temp 98.5 F (36.9 C) (Oral)   Ht '5\' 11"'$  (1.803 m)   Wt 190 lb 2 oz (86.2 kg)   SpO2 98%   BMI 26.52 kg/m    CC:  Chief Complaint  Patient presents with   Annual Exam    Subjective:   HPI: Raymond Lloyd is a 51 y.o. male presenting on 09/22/2022 for Annual Exam  The patient presents for annual complete physical and review of chronic health problems. He/She also has the following acute concerns today: none  Wt Readings from Last 3 Encounters:  09/22/22 190 lb 2 oz (86.2 kg)  08/16/22 191 lb (86.6 kg)  07/12/22 191 lb (86.6 kg)     Hypertriglyceridemia  Due for cholesterol and PSA  check.   Diet: heart healthy  Exercise:  minimal   He is moving to [place] for job change.     Relevant past medical, surgical, family and social history reviewed and updated as indicated. Interim medical history since our last visit reviewed. Allergies and medications reviewed and updated. Outpatient Medications Prior to Visit  Medication Sig Dispense Refill   Clocortolone Pivalate (CLODERM) 0.1 % cream      fexofenadine (ALLEGRA) 180 MG tablet Take 180 mg by mouth daily.     fluticasone (CUTIVATE) 0.005 % ointment Apply topically 2 (two) times daily as needed.      fluticasone (FLONASE) 50 MCG/ACT nasal spray SPRAY 2 SPRAYS INTO EACH NOSTRIL EVERY DAY 16 mL 2   loperamide (IMODIUM) 2 MG capsule Take 2 mg by mouth 2 (two) times daily as needed.     meloxicam (MOBIC) 15 MG tablet TAKE 1 TABLET BY MOUTH EVERY DAY 30 tablet 3   Omega-3 Fatty Acids (FISH OIL PO) Take 1200 by mouth, two in am and one pm     No facility-administered medications prior to visit.     Per HPI unless specifically indicated in ROS section below Review of Systems  Constitutional:  Negative for fatigue and fever.  HENT:  Negative for ear pain.   Eyes:  Negative for pain.   Respiratory:  Negative for cough and shortness of breath.   Cardiovascular:  Negative for chest pain, palpitations and leg swelling.  Gastrointestinal:  Negative for abdominal pain.  Genitourinary:  Negative for dysuria.  Musculoskeletal:  Negative for arthralgias.  Neurological:  Negative for syncope, light-headedness and headaches.  Psychiatric/Behavioral:  Negative for dysphoric mood.    Objective:  BP 126/80   Pulse 74   Temp 98.5 F (36.9 C) (Oral)   Ht '5\' 11"'$  (1.803 m)   Wt 190 lb 2 oz (86.2 kg)   SpO2 98%   BMI 26.52 kg/m   Wt Readings from Last 3 Encounters:  09/22/22 190 lb 2 oz (86.2 kg)  08/16/22 191 lb (86.6 kg)  07/12/22 191 lb (86.6 kg)      Physical Exam Constitutional:      General: He is not in acute distress.    Appearance: Normal appearance. He is well-developed. He is not ill-appearing or toxic-appearing.  HENT:     Head: Normocephalic and atraumatic.     Right Ear: Hearing, tympanic membrane, ear canal and external ear normal.     Left Ear: Hearing, tympanic membrane, ear canal and external ear normal.  Nose: Nose normal.     Mouth/Throat:     Pharynx: Uvula midline.  Eyes:     General: Lids are normal. Lids are everted, no foreign bodies appreciated.     Conjunctiva/sclera: Conjunctivae normal.     Pupils: Pupils are equal, round, and reactive to light.  Neck:     Thyroid: No thyroid mass or thyromegaly.     Vascular: No carotid bruit.     Trachea: Trachea and phonation normal.  Cardiovascular:     Rate and Rhythm: Normal rate and regular rhythm.     Pulses: Normal pulses.     Heart sounds: S1 normal and S2 normal. No murmur heard.    No gallop.  Pulmonary:     Breath sounds: Normal breath sounds. No wheezing, rhonchi or rales.  Abdominal:     General: Bowel sounds are normal.     Palpations: Abdomen is soft.     Tenderness: There is no abdominal tenderness. There is no guarding or rebound.     Hernia: No hernia is present.   Musculoskeletal:     Cervical back: Normal range of motion and neck supple.  Lymphadenopathy:     Cervical: No cervical adenopathy.  Skin:    General: Skin is warm and dry.     Findings: No rash.  Neurological:     Mental Status: He is alert.     Cranial Nerves: No cranial nerve deficit.     Sensory: No sensory deficit.     Gait: Gait normal.     Deep Tendon Reflexes: Reflexes are normal and symmetric.  Psychiatric:        Speech: Speech normal.        Behavior: Behavior normal.        Judgment: Judgment normal.      Results for orders placed or performed in visit on 09/20/21  Lipid panel  Result Value Ref Range   Cholesterol 244 (H) 0 - 200 mg/dL   Triglycerides 243.0 (H) 0.0 - 149.0 mg/dL   HDL 48.50 >39.00 mg/dL   VLDL 48.6 (H) 0.0 - 40.0 mg/dL   Total CHOL/HDL Ratio 5    NonHDL 195.12   Comprehensive metabolic panel  Result Value Ref Range   Sodium 141 135 - 145 mEq/L   Potassium 4.4 3.5 - 5.1 mEq/L   Chloride 104 96 - 112 mEq/L   CO2 31 19 - 32 mEq/L   Glucose, Bld 96 70 - 99 mg/dL   BUN 21 6 - 23 mg/dL   Creatinine, Ser 0.91 0.40 - 1.50 mg/dL   Total Bilirubin 0.5 0.2 - 1.2 mg/dL   Alkaline Phosphatase 68 39 - 117 U/L   AST 23 0 - 37 U/L   ALT 37 0 - 53 U/L   Total Protein 7.0 6.0 - 8.3 g/dL   Albumin 4.3 3.5 - 5.2 g/dL   GFR 99.28 >60.00 mL/min   Calcium 9.2 8.4 - 10.5 mg/dL  PSA  Result Value Ref Range   PSA 0.49 0.10 - 4.00 ng/mL  Hepatitis C antibody  Result Value Ref Range   Hepatitis C Ab NON-REACTIVE NON-REACTIVE   SIGNAL TO CUT-OFF 0.04 <1.00  LDL cholesterol, direct  Result Value Ref Range   Direct LDL 127.0 mg/dL     COVID 19 screen:  No recent travel or known exposure to COVID19 The patient denies respiratory symptoms of COVID 19 at this time. The importance of social distancing was discussed today.   Assessment and Plan The patient's  preventative maintenance and recommended screening tests for an annual wellness exam were reviewed in  full today. Brought up to date unless services declined.  Counselled on the importance of diet, exercise, and its role in overall health and mortality. The patient's FH and SH was reviewed, including their home life, tobacco status, and drug and alcohol status.   Nonsmoker.   Tdap  and flu vaccine uptodate Consider Shingrix  Discussed COVID19 vaccine side effects and benefits. Strongly encouraged the patient to get the vaccine. Questions answered. No prostate cancer  family history Nml colonoscopy 08/2022 Dr. Carlean Purl.  Repeat in 10 years.  STD screen:  Refused.  Hep C:   negative.   Problem List Items Addressed This Visit     Hypertriglyceridemia   Relevant Orders   Lipid panel   Comprehensive metabolic panel   Other Visit Diagnoses     Routine general medical examination at a health care facility    -  Primary   Screening for prostate cancer       Relevant Orders   PSA        Eliezer Lofts, MD

## 2022-09-22 NOTE — Patient Instructions (Signed)
Get back to regular exercise.

## 2022-12-28 ENCOUNTER — Other Ambulatory Visit: Payer: Self-pay | Admitting: Family Medicine

## 2022-12-28 NOTE — Telephone Encounter (Signed)
Last office visit 11/23/2021 for CPE.  Last refilled 08/29/2022 for #30 with 3 refills.  Next appt: No future appointments.
